# Patient Record
Sex: Male | Born: 1950 | Race: Black or African American | Hispanic: No | Marital: Single | Smoking: Former smoker
Health system: Southern US, Community
[De-identification: ages and names within clinical notes are randomized; demographics above are authoritative.]

## PROBLEM LIST (undated history)

## (undated) DIAGNOSIS — IMO0001 Reserved for inherently not codable concepts without codable children: Secondary | ICD-10-CM

## (undated) DIAGNOSIS — I10 Essential (primary) hypertension: Secondary | ICD-10-CM

## (undated) DIAGNOSIS — I639 Cerebral infarction, unspecified: Secondary | ICD-10-CM

## (undated) DIAGNOSIS — E785 Hyperlipidemia, unspecified: Secondary | ICD-10-CM

## (undated) DIAGNOSIS — F17200 Nicotine dependence, unspecified, uncomplicated: Secondary | ICD-10-CM

## (undated) HISTORY — DX: Hyperlipidemia, unspecified: E78.5

## (undated) HISTORY — DX: Cerebral infarction, unspecified: I63.9

---

## 2015-04-26 ENCOUNTER — Encounter (HOSPITAL_COMMUNITY): Payer: Self-pay

## 2015-04-26 ENCOUNTER — Emergency Department (HOSPITAL_COMMUNITY)
Admission: EM | Admit: 2015-04-26 | Discharge: 2015-04-26 | Disposition: A | Discharge status: Home / Self Care | Payer: No Typology Code available for payment source | Attending: Emergency Medicine | Admitting: Emergency Medicine

## 2015-04-26 DIAGNOSIS — Y92410 Unspecified street and highway as the place of occurrence of the external cause: Secondary | ICD-10-CM | POA: Diagnosis not present

## 2015-04-26 DIAGNOSIS — Y9389 Activity, other specified: Secondary | ICD-10-CM | POA: Insufficient documentation

## 2015-04-26 DIAGNOSIS — Z720 Tobacco use: Secondary | ICD-10-CM | POA: Diagnosis not present

## 2015-04-26 DIAGNOSIS — Z041 Encounter for examination and observation following transport accident: Secondary | ICD-10-CM | POA: Diagnosis not present

## 2015-04-26 DIAGNOSIS — S4992XA Unspecified injury of left shoulder and upper arm, initial encounter: Secondary | ICD-10-CM | POA: Diagnosis present

## 2015-04-26 DIAGNOSIS — Y998 Other external cause status: Secondary | ICD-10-CM | POA: Insufficient documentation

## 2015-04-26 NOTE — Discharge Instructions (Signed)
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
## 2016-10-22 ENCOUNTER — Inpatient Hospital Stay (HOSPITAL_COMMUNITY): Payer: Medicare Other

## 2016-10-22 ENCOUNTER — Encounter (HOSPITAL_COMMUNITY): Payer: Self-pay | Admitting: Neurology

## 2016-10-22 ENCOUNTER — Emergency Department (HOSPITAL_COMMUNITY): Payer: Medicare Other

## 2016-10-22 ENCOUNTER — Inpatient Hospital Stay (HOSPITAL_COMMUNITY)
Admission: EM | Admit: 2016-10-22 | Discharge: 2016-10-25 | DRG: 062 | Disposition: A | Discharge status: Home Health | Payer: Medicare Other | Attending: Neurology | Admitting: Neurology

## 2016-10-22 DIAGNOSIS — E785 Hyperlipidemia, unspecified: Secondary | ICD-10-CM | POA: Diagnosis present

## 2016-10-22 DIAGNOSIS — I63512 Cerebral infarction due to unspecified occlusion or stenosis of left middle cerebral artery: Secondary | ICD-10-CM | POA: Diagnosis not present

## 2016-10-22 DIAGNOSIS — Z23 Encounter for immunization: Secondary | ICD-10-CM | POA: Diagnosis not present

## 2016-10-22 DIAGNOSIS — Z6829 Body mass index (BMI) 29.0-29.9, adult: Secondary | ICD-10-CM | POA: Diagnosis not present

## 2016-10-22 DIAGNOSIS — R4702 Dysphasia: Secondary | ICD-10-CM | POA: Diagnosis not present

## 2016-10-22 DIAGNOSIS — R29810 Facial weakness: Secondary | ICD-10-CM | POA: Diagnosis present

## 2016-10-22 DIAGNOSIS — R911 Solitary pulmonary nodule: Secondary | ICD-10-CM

## 2016-10-22 DIAGNOSIS — E663 Overweight: Secondary | ICD-10-CM | POA: Diagnosis present

## 2016-10-22 DIAGNOSIS — R4189 Other symptoms and signs involving cognitive functions and awareness: Secondary | ICD-10-CM

## 2016-10-22 DIAGNOSIS — G8194 Hemiplegia, unspecified affecting left nondominant side: Secondary | ICD-10-CM | POA: Diagnosis present

## 2016-10-22 DIAGNOSIS — I63412 Cerebral infarction due to embolism of left middle cerebral artery: Principal | ICD-10-CM | POA: Diagnosis present

## 2016-10-22 DIAGNOSIS — F17200 Nicotine dependence, unspecified, uncomplicated: Secondary | ICD-10-CM | POA: Diagnosis not present

## 2016-10-22 DIAGNOSIS — I509 Heart failure, unspecified: Secondary | ICD-10-CM

## 2016-10-22 DIAGNOSIS — I6789 Other cerebrovascular disease: Secondary | ICD-10-CM | POA: Diagnosis not present

## 2016-10-22 DIAGNOSIS — F17210 Nicotine dependence, cigarettes, uncomplicated: Secondary | ICD-10-CM | POA: Diagnosis present

## 2016-10-22 DIAGNOSIS — I639 Cerebral infarction, unspecified: Secondary | ICD-10-CM | POA: Diagnosis present

## 2016-10-22 DIAGNOSIS — I10 Essential (primary) hypertension: Secondary | ICD-10-CM | POA: Diagnosis present

## 2016-10-22 DIAGNOSIS — Z720 Tobacco use: Secondary | ICD-10-CM

## 2016-10-22 DIAGNOSIS — R0989 Other specified symptoms and signs involving the circulatory and respiratory systems: Secondary | ICD-10-CM

## 2016-10-22 DIAGNOSIS — R4701 Aphasia: Secondary | ICD-10-CM | POA: Diagnosis present

## 2016-10-22 DIAGNOSIS — I69391 Dysphagia following cerebral infarction: Secondary | ICD-10-CM

## 2016-10-22 HISTORY — DX: Reserved for inherently not codable concepts without codable children: IMO0001

## 2016-10-22 HISTORY — DX: Nicotine dependence, unspecified, uncomplicated: F17.200

## 2016-10-22 HISTORY — DX: Essential (primary) hypertension: I10

## 2016-10-22 LAB — URINALYSIS, ROUTINE W REFLEX MICROSCOPIC
Bilirubin Urine: NEGATIVE
Glucose, UA: NEGATIVE mg/dL
Ketones, ur: 15 mg/dL — AB
LEUKOCYTES UA: NEGATIVE
Nitrite: NEGATIVE
PROTEIN: 100 mg/dL — AB
Specific Gravity, Urine: 1.016 (ref 1.005–1.030)
pH: 5.5 (ref 5.0–8.0)

## 2016-10-22 LAB — URINE MICROSCOPIC-ADD ON

## 2016-10-22 LAB — COMPREHENSIVE METABOLIC PANEL
ALT: 30 U/L (ref 17–63)
AST: 25 U/L (ref 15–41)
Albumin: 3.8 g/dL (ref 3.5–5.0)
Alkaline Phosphatase: 56 U/L (ref 38–126)
Anion gap: 9 (ref 5–15)
BILIRUBIN TOTAL: 1.7 mg/dL — AB (ref 0.3–1.2)
BUN: 14 mg/dL (ref 6–20)
CO2: 23 mmol/L (ref 22–32)
CREATININE: 1.21 mg/dL (ref 0.61–1.24)
Calcium: 8.9 mg/dL (ref 8.9–10.3)
Chloride: 109 mmol/L (ref 101–111)
Glucose, Bld: 93 mg/dL (ref 65–99)
POTASSIUM: 4.1 mmol/L (ref 3.5–5.1)
Sodium: 141 mmol/L (ref 135–145)
TOTAL PROTEIN: 6.6 g/dL (ref 6.5–8.1)

## 2016-10-22 LAB — I-STAT CHEM 8, ED
BUN: 17 mg/dL (ref 6–20)
CALCIUM ION: 1.04 mmol/L — AB (ref 1.15–1.40)
CREATININE: 1.2 mg/dL (ref 0.61–1.24)
Chloride: 109 mmol/L (ref 101–111)
GLUCOSE: 86 mg/dL (ref 65–99)
HCT: 48 % (ref 39.0–52.0)
HEMOGLOBIN: 16.3 g/dL (ref 13.0–17.0)
Potassium: 4.1 mmol/L (ref 3.5–5.1)
Sodium: 141 mmol/L (ref 135–145)
TCO2: 23 mmol/L (ref 0–100)

## 2016-10-22 LAB — PROTIME-INR
INR: 1.07
Prothrombin Time: 13.9 seconds (ref 11.4–15.2)

## 2016-10-22 LAB — DIFFERENTIAL
BASOS ABS: 0 10*3/uL (ref 0.0–0.1)
Basophils Relative: 0 %
EOS ABS: 0.1 10*3/uL (ref 0.0–0.7)
EOS PCT: 1 %
LYMPHS ABS: 2.1 10*3/uL (ref 0.7–4.0)
Lymphocytes Relative: 36 %
MONO ABS: 0.3 10*3/uL (ref 0.1–1.0)
MONOS PCT: 6 %
Neutro Abs: 3.3 10*3/uL (ref 1.7–7.7)
Neutrophils Relative %: 57 %

## 2016-10-22 LAB — RAPID URINE DRUG SCREEN, HOSP PERFORMED
Amphetamines: NOT DETECTED
BARBITURATES: NOT DETECTED
Benzodiazepines: NOT DETECTED
COCAINE: NOT DETECTED
Opiates: NOT DETECTED
Tetrahydrocannabinol: NOT DETECTED

## 2016-10-22 LAB — CBC
HEMATOCRIT: 44.7 % (ref 39.0–52.0)
HEMOGLOBIN: 15.4 g/dL (ref 13.0–17.0)
MCH: 31.6 pg (ref 26.0–34.0)
MCHC: 34.5 g/dL (ref 30.0–36.0)
MCV: 91.8 fL (ref 78.0–100.0)
Platelets: 175 10*3/uL (ref 150–400)
RBC: 4.87 MIL/uL (ref 4.22–5.81)
RDW: 13.9 % (ref 11.5–15.5)
WBC: 5.8 10*3/uL (ref 4.0–10.5)

## 2016-10-22 LAB — I-STAT TROPONIN, ED: TROPONIN I, POC: 0.06 ng/mL (ref 0.00–0.08)

## 2016-10-22 LAB — ETHANOL

## 2016-10-22 LAB — APTT: aPTT: 29 seconds (ref 24–36)

## 2016-10-22 LAB — MRSA PCR SCREENING: MRSA BY PCR: NEGATIVE

## 2016-10-22 MED ORDER — ENSURE ENLIVE PO LIQD
237.0000 mL | Freq: Two times a day (BID) | ORAL | Status: DC
  Administered 2016-10-23 – 2016-10-25 (×4): 237 mL via ORAL

## 2016-10-22 MED ORDER — LABETALOL HCL 5 MG/ML IV SOLN
10.0000 mg | INTRAVENOUS | Status: DC | PRN
  Administered 2016-10-22 (×2): 10 mg via INTRAVENOUS
  Filled 2016-10-22 (×3): qty 4

## 2016-10-22 MED ORDER — ALTEPLASE (STROKE) FULL DOSE INFUSION
0.9000 mg/kg | Freq: Once | INTRAVENOUS | Status: AC
  Administered 2016-10-22: 85 mg via INTRAVENOUS
  Filled 2016-10-22: qty 100

## 2016-10-22 MED ORDER — NICARDIPINE HCL IN NACL 20-0.86 MG/200ML-% IV SOLN: 3.0000 mg/h | INTRAVENOUS | Status: DC

## 2016-10-22 MED ORDER — PNEUMOCOCCAL VAC POLYVALENT 25 MCG/0.5ML IJ INJ
0.5000 mL | INJECTION | INTRAMUSCULAR | Status: AC
  Administered 2016-10-23: 0.5 mL via INTRAMUSCULAR
  Filled 2016-10-22: qty 0.5

## 2016-10-22 MED ORDER — INFLUENZA VAC SPLIT QUAD 0.5 ML IM SUSY
0.5000 mL | PREFILLED_SYRINGE | INTRAMUSCULAR | Status: AC
  Administered 2016-10-23: 0.5 mL via INTRAMUSCULAR
  Filled 2016-10-22: qty 0.5

## 2016-10-22 MED ORDER — SODIUM CHLORIDE 0.9 % IV SOLN
INTRAVENOUS | Status: DC
  Administered 2016-10-22: 18:00:00 via INTRAVENOUS

## 2016-10-22 MED ORDER — ACETAMINOPHEN 650 MG RE SUPP: 650.0000 mg | RECTAL | Status: DC | PRN

## 2016-10-22 MED ORDER — AMLODIPINE BESYLATE 5 MG PO TABS
5.0000 mg | ORAL_TABLET | Freq: Every day | ORAL | Status: DC
  Administered 2016-10-22: 5 mg via ORAL
  Filled 2016-10-22: qty 1

## 2016-10-22 MED ORDER — ACETAMINOPHEN 325 MG PO TABS: 650.0000 mg | ORAL_TABLET | ORAL | Status: DC | PRN

## 2016-10-22 MED ORDER — SENNOSIDES-DOCUSATE SODIUM 8.6-50 MG PO TABS: 1.0000 | ORAL_TABLET | Freq: Every evening | ORAL | Status: DC | PRN

## 2016-10-22 MED ORDER — FAMOTIDINE IN NACL 20-0.9 MG/50ML-% IV SOLN
20.0000 mg | Freq: Two times a day (BID) | INTRAVENOUS | Status: DC
  Administered 2016-10-22 – 2016-10-24 (×4): 20 mg via INTRAVENOUS
  Filled 2016-10-22 (×4): qty 50

## 2016-10-22 MED ORDER — IOPAMIDOL (ISOVUE-370) INJECTION 76%
INTRAVENOUS | Status: AC
  Administered 2016-10-22: 100 mL
  Filled 2016-10-22: qty 100

## 2016-10-22 MED ORDER — STROKE: EARLY STAGES OF RECOVERY BOOK
Freq: Once | Status: AC
  Administered 2016-10-22: 18:00:00
  Filled 2016-10-22: qty 1
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
## 2016-10-22 NOTE — ED Notes (Signed)
 10mg  labetolol given at 1519 for BP 148/110

## 2016-10-22 NOTE — Progress Notes (Signed)
Paged MD regarding return of facial droop and increasing aphasia. (NIH increase from 1 to 5).

## 2016-10-22 NOTE — Progress Notes (Signed)
Paged MD on completion of EKG. Pt returned to baseline and blood pressure came down before labetalol was given. NIH  1.

## 2016-10-22 NOTE — ED Notes (Signed)
Report given to Waltersasey, RN on [id number]. Transported to ICU by Council MechanicHelle, RN and Shanda BumpsJessica, Stroke RN.

## 2016-10-22 NOTE — ED Notes (Signed)
Patient has returned from CT at this time. 
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
## 2016-10-22 NOTE — Progress Notes (Addendum)
Paged regarding worsening aphasia. Patient went from mild aphasia with following commands to dense aphasia. He had a slightly high diastolic(115) and received labetalol for this. His worsening happened at a BP of 124/94. I advised NS bolus and stat CT which was unrevealing, CT angio/perfusion also unrevealing. Following CT, his BP improved as did his aphasia, back to his (since arrival to ICU) baseline.   At this time, I suspect that he has a distal cortical area of ischemia which is blood pressure dependant. In this setting, I would not favor being as aggressive with BP  As we typically are in the post TPA period, especially given the severity of the deficits he developed. I have advised to increase the parameters to 185/120 to trigger treatment. If he sustains high BP, then this may need to be considered with a short acting agent which could be reversed if needed.   Will continue to monitor.  This patient is critically ill and at significant risk of neurological worsening, death and care requires constant monitoring of vital signs, hemodynamics,respiratory and cardiac monitoring, neurological assessment, discussion with family, other specialists and medical decision making of high complexity. I spent 40 minutes of neurocritical care time  in the care of  this patient.  Ritta SlotMcNeill Kirkpatrick, MD [facility] [phone]  If 7pm- 7am, please page neurology on call as listed in AMION. 10/22/2016  10:30 PM

## 2016-10-22 NOTE — ED Notes (Signed)
Patient undressed, in gown, on monitor, continuous pulse oximetry and blood pressure cuff 

## 2016-10-22 NOTE — ED Triage Notes (Signed)
Per EMS - pt called family member around [phone] today. Pt went over to family member's house, did not get out of car. Family member went to car and noticed pt drooling, not responding verbally. Upon EMS arrival, pt tracking with eyes, able to answer some questions by shaking head yes and no. Pt not following commands, difficulty getting words out.

## 2016-10-22 NOTE — Progress Notes (Signed)
Paged MD regarding patient diaphoretic, and high blood pressure. NIH 1. Orders received for EKG, 5 mg Labetalol. Pt denies chest pain.

## 2016-10-23 ENCOUNTER — Inpatient Hospital Stay (HOSPITAL_COMMUNITY): Payer: Medicare Other

## 2016-10-23 ENCOUNTER — Other Ambulatory Visit (HOSPITAL_COMMUNITY): Payer: Medicare Other

## 2016-10-23 ENCOUNTER — Other Ambulatory Visit (HOSPITAL_COMMUNITY): Payer: Self-pay | Admitting: Radiology

## 2016-10-23 DIAGNOSIS — R4189 Other symptoms and signs involving cognitive functions and awareness: Secondary | ICD-10-CM

## 2016-10-23 DIAGNOSIS — F17200 Nicotine dependence, unspecified, uncomplicated: Secondary | ICD-10-CM

## 2016-10-23 DIAGNOSIS — Z720 Tobacco use: Secondary | ICD-10-CM

## 2016-10-23 DIAGNOSIS — I639 Cerebral infarction, unspecified: Secondary | ICD-10-CM

## 2016-10-23 DIAGNOSIS — I63412 Cerebral infarction due to embolism of left middle cerebral artery: Principal | ICD-10-CM

## 2016-10-23 DIAGNOSIS — I69391 Dysphagia following cerebral infarction: Secondary | ICD-10-CM

## 2016-10-23 DIAGNOSIS — I10 Essential (primary) hypertension: Secondary | ICD-10-CM

## 2016-10-23 DIAGNOSIS — R911 Solitary pulmonary nodule: Secondary | ICD-10-CM

## 2016-10-23 DIAGNOSIS — E785 Hyperlipidemia, unspecified: Secondary | ICD-10-CM

## 2016-10-23 DIAGNOSIS — R4701 Aphasia: Secondary | ICD-10-CM

## 2016-10-23 HISTORY — DX: Cerebral infarction, unspecified: I63.9

## 2016-10-23 LAB — LIPID PANEL
CHOL/HDL RATIO: 4.1 ratio
CHOLESTEROL: 198 mg/dL (ref 0–200)
HDL: 48 mg/dL (ref >40)
LDL CALC: 127 mg/dL — AB (ref 0–99)
TRIGLYCERIDES: 117 mg/dL (ref <150)
VLDL: 23 mg/dL (ref 0–40)

## 2016-10-23 MED ORDER — SODIUM CHLORIDE 0.9 % IV SOLN
500.0000 mL | Freq: Once | INTRAVENOUS | Status: AC
Start: 2016-10-22 — End: 2016-10-22
  Administered 2016-10-22: 500 mL via INTRAVENOUS

## 2016-10-23 MED ORDER — ATORVASTATIN CALCIUM 20 MG PO TABS: 20.0000 mg | ORAL_TABLET | Freq: Every day | ORAL | Status: DC

## 2016-10-23 MED ORDER — LABETALOL HCL 5 MG/ML IV SOLN: 10.0000 mg | INTRAVENOUS | Status: DC | PRN

## 2016-10-23 MED ORDER — SODIUM CHLORIDE 0.9 % IV SOLN
INTRAVENOUS | Status: DC
  Administered 2016-10-23 – 2016-10-25 (×3): via INTRAVENOUS

## 2016-10-23 MED ORDER — ORAL CARE MOUTH RINSE
15.0000 mL | Freq: Two times a day (BID) | OROMUCOSAL | Status: DC
  Administered 2016-10-23 – 2016-10-25 (×4): 15 mL via OROMUCOSAL

## 2016-10-23 MED ORDER — FUROSEMIDE 10 MG/ML IJ SOLN
10.0000 mg | Freq: Once | INTRAMUSCULAR | Status: AC
  Administered 2016-10-23: 10 mg via INTRAVENOUS
  Filled 2016-10-23: qty 2

## 2016-10-23 MED ORDER — ALBUMIN HUMAN 5 % IV SOLN
12.5000 g | Freq: Four times a day (QID) | INTRAVENOUS | Status: AC
  Administered 2016-10-23 – 2016-10-24 (×4): 12.5 g via INTRAVENOUS
  Filled 2016-10-23 (×4): qty 250

## 2016-10-23 MED ORDER — SODIUM CHLORIDE 0.9 % IV SOLN
500.0000 mL | Freq: Once | INTRAVENOUS | Status: AC
  Administered 2016-10-23: 500 mL via INTRAVENOUS

## 2016-10-23 NOTE — Consult Note (Signed)
Physical Medicine and Rehabilitation Consult Reason for Consult: CVA Referring Physician: Dr.Xu    HPI: Ronald Terry is a 65 y.o.right handed  male with history of hypertension as well as tobacco abuse. Per chart review and pt's son, patient lives with brother. Independent prior to admission. One level home with 5 steps to entry. Presented 10/22/2016 with right-sided weakness and aphasia. Cranial CT scan negative. Patient did receive TPA. CTA of head and neck showed no high-grade or correctable stenosis. MRI and MRA are pending. Incidental finding of potential clinical significance found 7 mm right upper lobe pulmonary nodule advise CT of chest in 6-12 months. EEG negative. Echocardiogram pending. Neurology consulted with workup currently ongoing. Physical and occupational therapy evaluation completed 10/23/2016 with recommendations of physical medicine rehabilitation consult. Earlier today, noted to be unresponsive, then with left flaccid weakness, but ?back to baseline 2 hours later.    Review of Systems  Constitutional: Negative for chills and fever.  Eyes: Negative for blurred vision and double vision.  Respiratory: Negative for cough and shortness of breath.   Cardiovascular: Negative for chest pain, palpitations and leg swelling.  Gastrointestinal: Positive for constipation. Negative for nausea and vomiting.  Genitourinary: Positive for urgency. Negative for dysuria and hematuria.  Skin: Negative for rash.  Neurological: Negative for sensory change, speech change, focal weakness, seizures and headaches.       Transient weakness  All other systems reviewed and are negative.  Past Medical History:  Diagnosis Date  . HTN (hypertension)   . Smoking    History reviewed. No pertinent surgical history. History reviewed. No pertinent family history. Social History:  reports that he has been smoking Cigarettes.  He has a 50.00 pack-year smoking history. He has never used smokeless  tobacco. He reports that he drinks alcohol. He reports that he does not use drugs. Allergies: No Known Allergies Medications Prior to Admission  Medication Sig Dispense Refill  . acetaminophen (TYLENOL) 500 MG tablet Take 1,500 mg by mouth every 6 (six) hours as needed for moderate pain or headache.      Home: Home Living Family/patient expects to be discharged to:: Private residence Living Arrangements: Other relatives (lives with brother) Available Help at Discharge: Family, Available 24 hours/day Type of Home: House Home Access: Stairs to enter Secretary/administrator of Steps: 5 Home Layout: One level Bathroom Shower/Tub: Engineer, manufacturing systems: Standard Home Equipment: None Additional Comments: drives   Lives With: Family (brother)  Functional History: Prior Function Level of Independence: Independent Functional Status:  Mobility: Bed Mobility Overal bed mobility: Needs Assistance Bed Mobility: Rolling Rolling: +2 for physical assistance, Max assist General bed mobility comments: Pt log R and L to complete peri care. Pt no initation at this time. flaccid L UE  Transfers General transfer comment: n/a this time      ADL: ADL Overall ADL's : Needs assistance/impaired Eating/Feeding: Set up, Bed level Eating/Feeding Details (indicate cue type and reason): completed full meal on arrival General ADL Comments: Pt supine alert on arrival and greeted therapist. Pt reports breakfast was good and he ate it all. pt noted to be incontinent of bladder and bowel. Pt with decr arousal in the middle of discussion. Speech more slurred in nature. Pt log rolled R and L for hygiene . RN called to room due to decr arousal and L buttock with wound present  Cognition: Cognition Overall Cognitive Status: Impaired/Different from baseline Arousal/Alertness: Awake/alert Orientation Level: Oriented to place, Oriented to person, Oriented to  situation Attention: Sustained Sustained  Attention: Appears intact Memory: Impaired Memory Impairment: Storage deficit, Retrieval deficit, Decreased recall of new information (1/2 independent, 1/4 semantic cue, 2/4 incorrect w/ choice) Awareness: Appears intact Problem Solving: Appears intact Safety/Judgment: Impaired (questionable) Cognition Arousal/Alertness: Lethargic Behavior During Therapy: Flat affect Overall Cognitive Status: Impaired/Different from baseline  Blood pressure 123/81, pulse 92, temperature 97.7 F (36.5 C), temperature source Oral, resp. rate 18, height 5\' 9"  (1.753 m), weight 90.4 kg (199 lb 4.7 oz), SpO2 99 %. Physical Exam  Vitals reviewed. Constitutional: He appears well-developed and well-nourished.  HENT:  Head: Normocephalic and atraumatic.  Poor dentition  Eyes: EOM are normal.  Pupils reactive to light  Neck: Normal range of motion. Neck supple. No thyromegaly present.  Cardiovascular: Normal rate and regular rhythm.   Respiratory: Effort normal and breath sounds normal. No respiratory distress.  GI: Soft. Bowel sounds are normal. He exhibits no distension.  Musculoskeletal: He exhibits no edema or tenderness.  Neurological: He is alert.  Makes good eye contact with examiner.  Waxing/Waning congnition and strength.   A&Ox3 Motor: ~5/5 throughout. DTRs symmetric Per nursing and son left side flaccid with facial droop and slurred speech ~2 hours prior to exam  Skin: Skin is warm and dry.  Psychiatric:  Mood and affect appear slightly altered with ?inappropriate laughter, however pt's son, states at baseline    Results for orders placed or performed during the hospital encounter of 10/22/16 (from the past 24 hour(s))  Ethanol     Status: None   Collection Time: 10/22/16  3:03 PM  Result Value Ref Range   Alcohol, Ethyl (B) <5 <5 mg/dL  Protime-INR     Status: None   Collection Time: 10/22/16  3:03 PM  Result Value Ref Range   Prothrombin Time 13.9 11.4 - 15.2 seconds   INR 1.07     APTT     Status: None   Collection Time: 10/22/16  3:03 PM  Result Value Ref Range   aPTT 29 24 - 36 seconds  CBC     Status: None   Collection Time: 10/22/16  3:03 PM  Result Value Ref Range   WBC 5.8 4.0 - 10.5 K/uL   RBC 4.87 4.22 - 5.81 MIL/uL   Hemoglobin 15.4 13.0 - 17.0 g/dL   HCT 16.1 09.6 - 04.5 %   MCV 91.8 78.0 - 100.0 fL   MCH 31.6 26.0 - 34.0 pg   MCHC 34.5 30.0 - 36.0 g/dL   RDW 40.9 81.1 - 91.4 %   Platelets 175 150 - 400 K/uL  Differential     Status: None   Collection Time: 10/22/16  3:03 PM  Result Value Ref Range   Neutrophils Relative % 57 %   Neutro Abs 3.3 1.7 - 7.7 K/uL   Lymphocytes Relative 36 %   Lymphs Abs 2.1 0.7 - 4.0 K/uL   Monocytes Relative 6 %   Monocytes Absolute 0.3 0.1 - 1.0 K/uL   Eosinophils Relative 1 %   Eosinophils Absolute 0.1 0.0 - 0.7 K/uL   Basophils Relative 0 %   Basophils Absolute 0.0 0.0 - 0.1 K/uL  Comprehensive metabolic panel     Status: Abnormal   Collection Time: 10/22/16  3:03 PM  Result Value Ref Range   Sodium 141 135 - 145 mmol/L   Potassium 4.1 3.5 - 5.1 mmol/L   Chloride 109 101 - 111 mmol/L   CO2 23 22 - 32 mmol/L   Glucose, Bld 93 65 -  99 mg/dL   BUN 14 6 - 20 mg/dL   Creatinine, Ser 1.61 0.61 - 1.24 mg/dL   Calcium 8.9 8.9 - 09.6 mg/dL   Total Protein 6.6 6.5 - 8.1 g/dL   Albumin 3.8 3.5 - 5.0 g/dL   AST 25 15 - 41 U/L   ALT 30 17 - 63 U/L   Alkaline Phosphatase 56 38 - 126 U/L   Total Bilirubin 1.7 (H) 0.3 - 1.2 mg/dL   GFR calc non Af Amer >60 >60 mL/min   GFR calc Af Amer >60 >60 mL/min   Anion gap 9 5 - 15  I-stat troponin, ED (not at [facility], [facility])     Status: None   Collection Time: 10/22/16  3:05 PM  Result Value Ref Range   Troponin i, poc 0.06 0.00 - 0.08 ng/mL   Comment 3          I-Stat Chem 8, ED  (not at [facility], [facility])     Status: Abnormal   Collection Time: 10/22/16  3:07 PM  Result Value Ref Range   Sodium 141 135 - 145 mmol/L   Potassium 4.1 3.5 - 5.1 mmol/L   Chloride 109 101 - 111  mmol/L   BUN 17 6 - 20 mg/dL   Creatinine, Ser 0.45 0.61 - 1.24 mg/dL   Glucose, Bld 86 65 - 99 mg/dL   Calcium, Ion 4.09 (L) 1.15 - 1.40 mmol/L   TCO2 23 0 - 100 mmol/L   Hemoglobin 16.3 13.0 - 17.0 g/dL   HCT 81.1 91.4 - 78.2 %  MRSA PCR Screening     Status: None   Collection Time: 10/22/16  4:14 PM  Result Value Ref Range   MRSA by PCR NEGATIVE NEGATIVE  Urine rapid drug screen (hosp performed)not at [facility]     Status: None   Collection Time: 10/22/16  5:42 PM  Result Value Ref Range   Opiates NONE DETECTED NONE DETECTED   Cocaine NONE DETECTED NONE DETECTED   Benzodiazepines NONE DETECTED NONE DETECTED   Amphetamines NONE DETECTED NONE DETECTED   Tetrahydrocannabinol NONE DETECTED NONE DETECTED   Barbiturates NONE DETECTED NONE DETECTED  Urinalysis, Routine w reflex microscopic (not at [facility])     Status: Abnormal   Collection Time: 10/22/16  5:42 PM  Result Value Ref Range   Color, Urine YELLOW YELLOW   APPearance CLEAR CLEAR   Specific Gravity, Urine 1.016 1.005 - 1.030   pH 5.5 5.0 - 8.0   Glucose, UA NEGATIVE NEGATIVE mg/dL   Hgb urine dipstick SMALL (A) NEGATIVE   Bilirubin Urine NEGATIVE NEGATIVE   Ketones, ur 15 (A) NEGATIVE mg/dL   Protein, ur 956 (A) NEGATIVE mg/dL   Nitrite NEGATIVE NEGATIVE   Leukocytes, UA NEGATIVE NEGATIVE  Urine microscopic-add on     Status: Abnormal   Collection Time: 10/22/16  5:42 PM  Result Value Ref Range   Squamous Epithelial / LPF 0-5 (A) NONE SEEN   WBC, UA 0-5 0 - 5 WBC/hpf   RBC / HPF 0-5 0 - 5 RBC/hpf   Bacteria, UA FEW (A) NONE SEEN   Casts GRANULAR CAST (A) NEGATIVE   Urine-Other MUCOUS PRESENT   Lipid panel     Status: Abnormal   Collection Time: 10/23/16  2:19 AM  Result Value Ref Range   Cholesterol 198 0 - 200 mg/dL   Triglycerides 213 <086 mg/dL   HDL 48 >57 mg/dL   Total CHOL/HDL Ratio 4.1 RATIO   VLDL 23 0 -  40 mg/dL   LDL Cholesterol 119 (H) 0 - 99 mg/dL   Ct Angio Head W Or Wo Contrast  Result Date:  10/22/2016 CLINICAL DATA:  Initial evaluation for acute aphasia. EXAM: CT CEREBRAL PERFUSION WITH CONTRAST; CT ANGIOGRAPHY NECK; CT ANGIOGRAPHY HEAD TECHNIQUE: Multidetector CT imaging of the neck was performed during bolus injection of intravenous contrast. Multiplanar CT angiographic image reconstructions including MIPs were generated to evaluate the extra-cranial carotid arteries.; Multidetector CT imaging of the brain was performed during bolus injection of intravenous contrast. Multiplanar CT angiographic image reconstructions including MIPs were generated to evaluate cerebral vasculature centered at the Circle of [place]. CONTRAST:  50 cc of Isovue 370. COMPARISON:  Prior noncontrast head CT performed earlier the same day. FINDINGS: CTA NECK Aortic arch: Visualized aortic arch is of normal caliber with normal 3 vessel morphology. No high-grade stenosis seen at the origin of the great vessels. Visualized subclavian arteries are widely patent and unremarkable. Right carotid system: Right common carotid artery widely patent from its origin to the bifurcation. Mild a centric calcified plaque about the right bifurcation without flow-limiting stenosis. Right ICA widely patent distally to the skullbase without stenosis, dissection, or occlusion. Right external carotid artery and its branches grossly normal. Left carotid system: Left common carotid artery widely patent from its origin to the bifurcation. Mild noncalcified atheromatous plaque about the left bifurcation without significant stenosis. Left ICA widely patent from the bifurcation to the skullbase. No stenosis, occlusion, or evidence for dissection within the left carotid artery system. Vertebral arteries: Both vertebral arteries arise from the subclavian arteries. Vertebral arteries patent within the neck without stenosis, dissection, or occlusion. Skeleton: No acute osseous abnormality. No worrisome lytic or blastic osseous lesions. Moderate to advanced  degenerative spondylolysis present at C4-5 through C6-7. Soft tissues of the neck: No acute soft tissue abnormality identified within the neck. Thyroid normal. No adenopathy. Upper chest: Enlarged mediastinal lymph nodes noted, measuring up to 15 mm in short access, indeterminate. Emphysematous changes noted within the visualized lungs. Scatter peribronchial thickening noted. 7 mm nodule at the posterior right upper lobe (series 501, image 22). CTA HEAD Anterior circulation: The petrous segments are widely patent bilaterally. Mild atheromatous plaque within the cavernous left ICA without significant stenosis. Cavernous and supraclinoid ICAs otherwise widely patent without flow-limiting stenosis. A1 segments widely patent. Anterior communicating artery normal. Anterior cerebral arteries well opacified to their distal aspects. M1 segments patent without stenosis or occlusion. MCA bifurcations within normal limits. No proximal M2 occlusion. There is an apparent focal severe proximal M2 stenosis on the left just prior to distal M3 branches (series 502, image 284 on axial sequence, series 504, image 173 on sagittal sequence). Contrast is seen distally within M3 branches. Distal MCA branches otherwise well opacified and symmetric. Posterior circulation: Vertebral arteries patent to the vertebrobasilar junction. Focal plaque within the left V4 segment with mild short-segment stenosis. Posterior inferior cerebral arteries are patent bilaterally. Basilar artery tortuous but widely patent to its distal aspect. Superior cerebellar arteries well opacified bilaterally. Both of the posterior cerebral artery supplied via the basilar artery and are well per few to the distal aspects. Venous sinuses: Patent.  No evidence for venous sinus thrombosis. Anatomic variants: No significant anatomic variant. No aneurysm or vascular malformation. Delayed phase:  Not performed. CT PERFUSION: CT perfusion was performed, however, no diagnostic  images were obtained. There is question of the perfusion scan true bearing off correct intracranial arterial vasculature, as the arterial and venous perfusion grafts closely mimic  each other. No appreciable perfusion mismatch is observed. IMPRESSION: CTA NECK IMPRESSION: 1. Mild atheromatous plaque about the carotid bifurcations bilaterally without flow limiting stenosis. No high-grade or correctable stenosis identified within either carotid artery system. 2. Patent vertebral arteries within the neck. 3. Enlarged mediastinal adenopathy as above, indeterminate. Further evaluation with dedicated CT of the chest suggested for complete evaluation. 4. Emphysema. 5. **An incidental finding of potential clinical significance has been found. 7 mm right upper lobe pulmonary nodule. Non-contrast chest CT at 6-12 months is recommended. If the nodule is stable at time of repeat CT, then future CT at 18-24 months (from today's scan) is considered optional for low-risk patients, but is recommended for high-risk patients. This recommendation follows the consensus statement: Guidelines for Management of Incidental Pulmonary Nodules Detected on CT Images: From the Fleischner Society 2017; Radiology 2017; 284:228-243.** CTA HEAD IMPRESSION: 1. Negative CTA for emergent large vessel occlusion. No correctable stenosis identified. 2. Probable short-segment severe distal left M2 stenosis as above. No other high-grade or critical stenosis within the intracranial circulation. 3. Mild atheromatous plaque within the carotid siphons bilaterally with mild stenosis. 4. Focal plaque within the left V4 segment with short-segment mild stenosis. CT PERFUSION IMPRESSION: Nondiagnostic CT perfusion due to technical error as detailed above. Critical Value/emergent results were discussed by telephone at the time of interpretation on 10/22/2016 at approximately the 10:30 pm to Dr. Ritta Slot , who verbally acknowledged these results.  Electronically Signed   By: Rise Mu M.D.   On: 10/22/2016 23:40   Ct Head Wo Contrast  Result Date: 10/23/2016 CLINICAL DATA:  65 year old male with elevated blood pressure. Increased confusion and slurred speech. Subsequent encounter. EXAM: CT HEAD WITHOUT CONTRAST TECHNIQUE: Contiguous axial images were obtained from the base of the skull through the vertex without intravenous contrast. COMPARISON:  CT angiogram and CT head 10/22/2012. FINDINGS: Brain: No intracranial hemorrhage or CT evidence of large acute infarct. Mild to moderate chronic microvascular changes. No hydrocephalus. No intracranial mass lesion noted on this unenhanced exam. Partially empty expanded sella. No second very findings of pseudotumor. Vascular: Mild plaque without hyperdense vessel. Skull: No acute abnormality. Sinuses/Orbits: Mild exophthalmos. Sinuses which are visualized are clear. Other: Negative IMPRESSION: No intracranial hemorrhage or CT evidence of large acute infarct. Mild to moderate chronic microvascular changes. Partially empty expanded sella. Electronically Signed   By: Lacy Duverney M.D.   On: 10/23/2016 10:39   Ct Angio Neck W Or Wo Contrast  Result Date: 10/22/2016 CLINICAL DATA:  Initial evaluation for acute aphasia. EXAM: CT CEREBRAL PERFUSION WITH CONTRAST; CT ANGIOGRAPHY NECK; CT ANGIOGRAPHY HEAD TECHNIQUE: Multidetector CT imaging of the neck was performed during bolus injection of intravenous contrast. Multiplanar CT angiographic image reconstructions including MIPs were generated to evaluate the extra-cranial carotid arteries.; Multidetector CT imaging of the brain was performed during bolus injection of intravenous contrast. Multiplanar CT angiographic image reconstructions including MIPs were generated to evaluate cerebral vasculature centered at the Circle of [place]. CONTRAST:  50 cc of Isovue 370. COMPARISON:  Prior noncontrast head CT performed earlier the same day. FINDINGS: CTA NECK  Aortic arch: Visualized aortic arch is of normal caliber with normal 3 vessel morphology. No high-grade stenosis seen at the origin of the great vessels. Visualized subclavian arteries are widely patent and unremarkable. Right carotid system: Right common carotid artery widely patent from its origin to the bifurcation. Mild a centric calcified plaque about the right bifurcation without flow-limiting stenosis. Right ICA widely patent distally to the skullbase  without stenosis, dissection, or occlusion. Right external carotid artery and its branches grossly normal. Left carotid system: Left common carotid artery widely patent from its origin to the bifurcation. Mild noncalcified atheromatous plaque about the left bifurcation without significant stenosis. Left ICA widely patent from the bifurcation to the skullbase. No stenosis, occlusion, or evidence for dissection within the left carotid artery system. Vertebral arteries: Both vertebral arteries arise from the subclavian arteries. Vertebral arteries patent within the neck without stenosis, dissection, or occlusion. Skeleton: No acute osseous abnormality. No worrisome lytic or blastic osseous lesions. Moderate to advanced degenerative spondylolysis present at C4-5 through C6-7. Soft tissues of the neck: No acute soft tissue abnormality identified within the neck. Thyroid normal. No adenopathy. Upper chest: Enlarged mediastinal lymph nodes noted, measuring up to 15 mm in short access, indeterminate. Emphysematous changes noted within the visualized lungs. Scatter peribronchial thickening noted. 7 mm nodule at the posterior right upper lobe (series 501, image 22). CTA HEAD Anterior circulation: The petrous segments are widely patent bilaterally. Mild atheromatous plaque within the cavernous left ICA without significant stenosis. Cavernous and supraclinoid ICAs otherwise widely patent without flow-limiting stenosis. A1 segments widely patent. Anterior communicating  artery normal. Anterior cerebral arteries well opacified to their distal aspects. M1 segments patent without stenosis or occlusion. MCA bifurcations within normal limits. No proximal M2 occlusion. There is an apparent focal severe proximal M2 stenosis on the left just prior to distal M3 branches (series 502, image 284 on axial sequence, series 504, image 173 on sagittal sequence). Contrast is seen distally within M3 branches. Distal MCA branches otherwise well opacified and symmetric. Posterior circulation: Vertebral arteries patent to the vertebrobasilar junction. Focal plaque within the left V4 segment with mild short-segment stenosis. Posterior inferior cerebral arteries are patent bilaterally. Basilar artery tortuous but widely patent to its distal aspect. Superior cerebellar arteries well opacified bilaterally. Both of the posterior cerebral artery supplied via the basilar artery and are well per few to the distal aspects. Venous sinuses: Patent.  No evidence for venous sinus thrombosis. Anatomic variants: No significant anatomic variant. No aneurysm or vascular malformation. Delayed phase:  Not performed. CT PERFUSION: CT perfusion was performed, however, no diagnostic images were obtained. There is question of the perfusion scan true bearing off correct intracranial arterial vasculature, as the arterial and venous perfusion grafts closely mimic each other. No appreciable perfusion mismatch is observed. IMPRESSION: CTA NECK IMPRESSION: 1. Mild atheromatous plaque about the carotid bifurcations bilaterally without flow limiting stenosis. No high-grade or correctable stenosis identified within either carotid artery system. 2. Patent vertebral arteries within the neck. 3. Enlarged mediastinal adenopathy as above, indeterminate. Further evaluation with dedicated CT of the chest suggested for complete evaluation. 4. Emphysema. 5. **An incidental finding of potential clinical significance has been found. 7 mm right  upper lobe pulmonary nodule. Non-contrast chest CT at 6-12 months is recommended. If the nodule is stable at time of repeat CT, then future CT at 18-24 months (from today's scan) is considered optional for low-risk patients, but is recommended for high-risk patients. This recommendation follows the consensus statement: Guidelines for Management of Incidental Pulmonary Nodules Detected on CT Images: From the Fleischner Society 2017; Radiology 2017; 284:228-243.** CTA HEAD IMPRESSION: 1. Negative CTA for emergent large vessel occlusion. No correctable stenosis identified. 2. Probable short-segment severe distal left M2 stenosis as above. No other high-grade or critical stenosis within the intracranial circulation. 3. Mild atheromatous plaque within the carotid siphons bilaterally with mild stenosis. 4. Focal plaque within the  left V4 segment with short-segment mild stenosis. CT PERFUSION IMPRESSION: Nondiagnostic CT perfusion due to technical error as detailed above. Critical Value/emergent results were discussed by telephone at the time of interpretation on 10/22/2016 at approximately the 10:30 pm to Dr. Ritta Slot , who verbally acknowledged these results. Electronically Signed   By: Rise Mu M.D.   On: 10/22/2016 23:40   Ct Cerebral Perfusion W Contrast  Result Date: 10/22/2016 CLINICAL DATA:  Initial evaluation for acute aphasia. EXAM: CT CEREBRAL PERFUSION WITH CONTRAST; CT ANGIOGRAPHY NECK; CT ANGIOGRAPHY HEAD TECHNIQUE: Multidetector CT imaging of the neck was performed during bolus injection of intravenous contrast. Multiplanar CT angiographic image reconstructions including MIPs were generated to evaluate the extra-cranial carotid arteries.; Multidetector CT imaging of the brain was performed during bolus injection of intravenous contrast. Multiplanar CT angiographic image reconstructions including MIPs were generated to evaluate cerebral vasculature centered at the [place] of [place].  CONTRAST:  50 cc of Isovue 370. COMPARISON:  Prior noncontrast head CT performed earlier the same day. FINDINGS: CTA NECK Aortic arch: Visualized aortic arch is of normal caliber with normal 3 vessel morphology. No high-grade stenosis seen at the origin of the great vessels. Visualized subclavian arteries are widely patent and unremarkable. Right carotid system: Right common carotid artery widely patent from its origin to the bifurcation. Mild a centric calcified plaque about the right bifurcation without flow-limiting stenosis. Right ICA widely patent distally to the skullbase without stenosis, dissection, or occlusion. Right external carotid artery and its branches grossly normal. Left carotid system: Left common carotid artery widely patent from its origin to the bifurcation. Mild noncalcified atheromatous plaque about the left bifurcation without significant stenosis. Left ICA widely patent from the bifurcation to the skullbase. No stenosis, occlusion, or evidence for dissection within the left carotid artery system. Vertebral arteries: Both vertebral arteries arise from the subclavian arteries. Vertebral arteries patent within the neck without stenosis, dissection, or occlusion. Skeleton: No acute osseous abnormality. No worrisome lytic or blastic osseous lesions. Moderate to advanced degenerative spondylolysis present at C4-5 through C6-7. Soft tissues of the neck: No acute soft tissue abnormality identified within the neck. Thyroid normal. No adenopathy. Upper chest: Enlarged mediastinal lymph nodes noted, measuring up to 15 mm in short access, indeterminate. Emphysematous changes noted within the visualized lungs. Scatter peribronchial thickening noted. 7 mm nodule at the posterior right upper lobe (series 501, image 22). CTA HEAD Anterior circulation: The petrous segments are widely patent bilaterally. Mild atheromatous plaque within the cavernous left ICA without significant stenosis. Cavernous and  supraclinoid ICAs otherwise widely patent without flow-limiting stenosis. A1 segments widely patent. Anterior communicating artery normal. Anterior cerebral arteries well opacified to their distal aspects. M1 segments patent without stenosis or occlusion. MCA bifurcations within normal limits. No proximal M2 occlusion. There is an apparent focal severe proximal M2 stenosis on the left just prior to distal M3 branches (series 502, image 284 on axial sequence, series 504, image 173 on sagittal sequence). Contrast is seen distally within M3 branches. Distal MCA branches otherwise well opacified and symmetric. Posterior circulation: Vertebral arteries patent to the vertebrobasilar junction. Focal plaque within the left V4 segment with mild short-segment stenosis. Posterior inferior cerebral arteries are patent bilaterally. Basilar artery tortuous but widely patent to its distal aspect. Superior cerebellar arteries well opacified bilaterally. Both of the posterior cerebral artery supplied via the basilar artery and are well per few to the distal aspects. Venous sinuses: Patent.  No evidence for venous sinus thrombosis. Anatomic variants: No  significant anatomic variant. No aneurysm or vascular malformation. Delayed phase:  Not performed. CT PERFUSION: CT perfusion was performed, however, no diagnostic images were obtained. There is question of the perfusion scan true bearing off correct intracranial arterial vasculature, as the arterial and venous perfusion grafts closely mimic each other. No appreciable perfusion mismatch is observed. IMPRESSION: CTA NECK IMPRESSION: 1. Mild atheromatous plaque about the carotid bifurcations bilaterally without flow limiting stenosis. No high-grade or correctable stenosis identified within either carotid artery system. 2. Patent vertebral arteries within the neck. 3. Enlarged mediastinal adenopathy as above, indeterminate. Further evaluation with dedicated CT of the chest suggested for  complete evaluation. 4. Emphysema. 5. **An incidental finding of potential clinical significance has been found. 7 mm right upper lobe pulmonary nodule. Non-contrast chest CT at 6-12 months is recommended. If the nodule is stable at time of repeat CT, then future CT at 18-24 months (from today's scan) is considered optional for low-risk patients, but is recommended for high-risk patients. This recommendation follows the consensus statement: Guidelines for Management of Incidental Pulmonary Nodules Detected on CT Images: From the Fleischner Society 2017; Radiology 2017; 284:228-243.** CTA HEAD IMPRESSION: 1. Negative CTA for emergent large vessel occlusion. No correctable stenosis identified. 2. Probable short-segment severe distal left M2 stenosis as above. No other high-grade or critical stenosis within the intracranial circulation. 3. Mild atheromatous plaque within the carotid siphons bilaterally with mild stenosis. 4. Focal plaque within the left V4 segment with short-segment mild stenosis. CT PERFUSION IMPRESSION: Nondiagnostic CT perfusion due to technical error as detailed above. Critical Value/emergent results were discussed by telephone at the time of interpretation on 10/22/2016 at approximately the 10:30 pm to Dr. Ritta Slot , who verbally acknowledged these results. Electronically Signed   By: Rise Mu M.D.   On: 10/22/2016 23:40   Dg Chest Port 1 View  Result Date: 10/23/2016 CLINICAL DATA:  Gurgling breath sounds EXAM: PORTABLE CHEST 1 VIEW COMPARISON:  None. FINDINGS: There is mild cardiomegaly. There is central vascular congestion. Hazy right greater than left edema or infiltrates. No large effusion. No pneumothorax. Degenerative changes of the AC joints. IMPRESSION: Mild cardiomegaly with central vascular congestion; hazy right greater than left pulmonary opacities, favor edema with infection not excluded. Electronically Signed   By: Jasmine Pang M.D.   On: 10/23/2016  03:25   Ct Head Code Stroke Wo Contrast  Result Date: 10/22/2016 CLINICAL DATA:  Code stroke. Altered mental status. Difficulty speaking. EXAM: CT HEAD WITHOUT CONTRAST TECHNIQUE: Contiguous axial images were obtained from the base of the skull through the vertex without intravenous contrast. COMPARISON:  None. FINDINGS: Brain: No evidence of acute infarction, hemorrhage, hydrocephalus, extra-axial collection or mass lesion/mass effect. Patchy low-density in the cerebral white matter, usually chronic microvascular disease. Vascular: No hyperdense vessel. Atherosclerotic calcifications mainly in the carotid siphons. Skull: Negative Sinuses/Orbits: Negative Other: Page at the time of interpretation on 10/22/2016 at 3:17 pm to Dr. Roxy Manns. Will addend once communicated verbally. ASPECTS [facility] Stroke Program Early CT Score) - Ganglionic level infarction (caudate, lentiform nuclei, internal capsule, insula, M1-M3 cortex): 7 - Supraganglionic infarction (M4-M6 cortex): 3 Total score (0-10 with 10 being normal): 10 IMPRESSION: 1. No acute finding. ASPECTS is 10. 2. Moderate white matter disease, likely chronic small vessel ischemia. Electronically Signed   By: Marnee Spring M.D.   On: 10/22/2016 15:20    Assessment/Plan: Diagnosis: Left CVA Labs and images independently reviewed.  Records reviewed and summated above. Stroke: Continue secondary stroke prophylaxis and Risk Factor  Modification listed below:   Antiplatelet therapy:   Blood Pressure Management:  Continue current medication with prn's with permisive HTN per primary team Statin Agent:   Tobacco abuse:   ?hemiparesis  1. Does the need for close, 24 hr/day medical supervision in concert with the patient's rehab needs make it unreasonable for this patient to be served in a less intensive setting? Potentially  2. Co-Morbidities requiring supervision/potential complications: HTN (monitor and provide prns in accordance with increased physical  exertion and pain, transition to orals when appropriate), tobacco abuse (counsel), right upper lobe pulmonary nodule (follow up as outpt), HLD (cont meds), ?dysphagia (unsafe for diet given lack of stability) 3. Due to safety, disease management, medication administration and patient education, does the patient require 24 hr/day rehab nursing? Potentially 4. Does the patient require coordinated care of a physician, rehab nurse, PT (1-2 hrs/day, 5 days/week), OT (1-2 hrs/day, 5 days/week) and SLP (1-2 hrs/day, 5 days/week) to address physical and functional deficits in the context of the above medical diagnosis(es)? Potentially Addressing deficits in the following areas: balance, endurance, locomotion, strength, transferring, bathing, dressing, toileting, cognition, speech, swallowing and psychosocial support 5. Can the patient actively participate in an intensive therapy program of at least 3 hrs of therapy per day at least 5 days per week? Potentially 6. The potential for patient to make measurable gains while on inpatient rehab is TBD 7. Anticipated functional outcomes upon discharge from inpatient rehab are modified independent and supervision  with PT, modified independent and supervision with OT, modified independent with SLP. 8. Estimated rehab length of stay to reach the above functional goals is: 12-15 days. 9. Does the patient have adequate social supports and living environment to accommodate these discharge functional goals? Potentially 10. Anticipated D/C setting: Home 11. Anticipated post D/C treatments: HH therapy and Home excercise program 12. Overall Rehab/Functional Prognosis: good  RECOMMENDATIONS: This patient's condition is appropriate for continued rehabilitative care in the following setting: Pt appears to have waxing and waning symptoms.  Will await completion of workup.  Based on evaluation, pt will not likely require CIR, however, during therapy session 2 hours prior pt was  unresponsive, per report.  Will also need to clarify caregiver support at discharge.  Patient has agreed to participate in recommended program. Potentially Note that insurance prior authorization may be required for reimbursement for recommended care.  Comment: Rehab Admissions Coordinator to follow up.  Maryla Morrow, MD, [place] 10/23/2016

## 2016-10-23 NOTE — Evaluation (Signed)
Physical Therapy Evaluation Patient Details Name: Ronald Terry MRN: [id number] DOB: [birth year] Today's Date: 10/23/2016   History of Present Illness  Ronald Terry is an 65 y.o. admitted after difficulty with gate and unable to speak.. CT head showed no acute stroke or bleed. MRI pending. CXR mild cardiomegaly with central vascular congestion; hazy right greater than left pulmonary opacities, favor edema with infection . Pending stat MRI  Clinical Impression  PT admitted with pending CVA workup underway. Pt with decr arousal, incontinence of urine and stool and decreased left sided movement during bed level asessment and hygiene care. RN and MD called immediately to room to assess. Pt currently with functional limitiations due to the deficits listed below. PTA was independent with all adls and drives. .  Pt will benefit from skilled PT to address deficits and maximize function. Will see as indicated and progress as tolerated. Recommend CIR consult.    Follow Up Recommendations CIR;Supervision/Assistance - 24 hour    Equipment Recommendations   (TBD)    Recommendations for Other Services Rehab consult     Precautions / Restrictions Precautions Precautions: Fall      Mobility  Bed Mobility Overal bed mobility: Needs Assistance Bed Mobility: Rolling Rolling: +2 for physical assistance;Max assist         General bed mobility comments: Pt log R and L to complete peri care. Pt no initation at this time. flaccid L UE   Transfers                 General transfer comment: n/a this time  Ambulation/Gait                Stairs            Wheelchair Mobility    Modified Rankin (Stroke Patients Only) Modified Rankin (Stroke Patients Only) Pre-Morbid Rankin Score: No symptoms Modified Rankin: Severe disability     Balance Overall balance assessment:  (unable to assess)                                           Pertinent Vitals/Pain  Pain Assessment: No/denies pain    Home Living Family/patient expects to be discharged to:: Private residence Living Arrangements: Other relatives (lives with brother) Available Help at Discharge: Family;Available 24 hours/day Type of Home: House Home Access: Stairs to enter   [employer] of Steps: 5 Home Layout: One level Home Equipment: None Additional Comments: drives     Prior Function Level of Independence: Independent               Hand Dominance   Dominant Hand: Right    Extremity/Trunk Assessment   Upper Extremity Assessment: LUE deficits/detail       LUE Deficits / Details: flaccid with no response , unable to hold against gravity. reaching with R UE for L UE    Lower Extremity Assessment: LLE deficits/detail   LLE Deficits / Details: flaccid initially during session. pt does demonstrate knee flexion toward end of session wth Dr Roda Shutters present evaluating patient     Communication   Communication: HOH;Expressive difficulties  Cognition Arousal/Alertness: Lethargic Behavior During Therapy: Flat affect Overall Cognitive Status: Impaired/Different from baseline                      General Comments General comments (skin integrity, edema, etc.): wound on L buttock size of a  dime    Exercises     Assessment/Plan    PT Assessment Patient needs continued PT services  PT Problem List Decreased strength;Decreased activity tolerance;Decreased balance;Decreased mobility;Decreased coordination;Impaired sensation;Impaired tone;Decreased safety awareness;Decreased cognition          PT Treatment Interventions DME instruction;Gait training;Functional mobility training;Therapeutic activities;Therapeutic exercise;Balance training;Neuromuscular re-education;Cognitive remediation;Patient/family education    PT Goals (Current goals can be found in the Care Plan section)  Acute Rehab PT Goals Patient Stated Goal: none stated PT Goal Formulation:  Patient unable to participate in goal setting Time For Goal Achievement: 11/06/16 Potential to Achieve Goals: Fair    Frequency Min 3X/week   Barriers to discharge        Co-evaluation               End of Session   Activity Tolerance: Treatment limited secondary to medical complications (Comment) (sudden onset of left sided flaccidity) Patient left: in bed;with call bell/phone within reach;with nursing/sitter in room;with family/visitor present (MD at bedside) Nurse Communication: Mobility status         Time: [id number] PT Time Calculation (min) (ACUTE ONLY): 26 min   Charges:   PT Evaluation $PT Eval Moderate Complexity: 1 Procedure     PT G CodesFabio Terry:        Ronald Terry 10/23/2016, 11:12 AM  Ronald Terry, PT DPT  [phone]

## 2016-10-23 NOTE — Progress Notes (Signed)
Rehab Admissions Coordinator Note:  Patient was screened by Trish MageLogue, Lalo Tromp M for appropriateness for an Inpatient Acute Rehab Consult.  At this time, we are recommending Inpatient Rehab consult.  Trish MageLogue, Syanna Remmert M 10/23/2016, 11:47 AM  I can be reached at [phone].

## 2016-10-23 NOTE — Progress Notes (Signed)
Dr. Roda ShuttersXu at bedside to assess patient. Continuing to monitor.

## 2016-10-23 NOTE — Progress Notes (Signed)
Initial Nutrition Assessment  INTERVENTION:   Ensure Enlive po BID, each supplement provides 350 kcal and 20 grams of protein   NUTRITION DIAGNOSIS:   Inadequate oral intake related to lethargy/confusion as evidenced by  (per RN).  GOAL:   Patient will meet greater than or equal to 90% of their needs  MONITOR:   PO intake, Diet advancement, Supplement acceptance, I & O's  REASON FOR ASSESSMENT:   Malnutrition Screening Tool    ASSESSMENT:   Pt with hx of HTN and smoking who was admitted with right sided weakness and aphasia. CT shows no acute stroke, MRI pending. Per neuro suspect distal cortical area of ischemia which is BP dependant.    Pt discussed during ICU rounds and with RN.  Per RN pt passed swallow eval but was more lethargic after so she has held his meals for now. EEG just completed but was normal.  Pt unable to answer specific questions about his weight or usual intake. Pt lives with brother per RN. Son at bedside and reports his dad looks the same to him without weight changes.   Medications reviewed Labs reviewed: LDL 127 Nutrition-Focused physical exam completed. Findings are no fat depletion, no muscle depletion, and no edema.     Diet Order:  Diet NPO time specified  Skin:  Reviewed, no issues  Last BM:  10/22  Height:   Ht Readings from Last 1 Encounters:  10/22/16 5\' 9"  (1.753 m)    Weight:   Wt Readings from Last 1 Encounters:  10/22/16 199 lb 4.7 oz (90.4 kg)    Ideal Body Weight:  72.7 kg  BMI:  Body mass index is 29.43 kg/m.  Estimated Nutritional Needs:   Kcal:  2100-2300  Protein:  108-120 grams  Fluid:  > 2.1 L/day  EDUCATION NEEDS:   No education needs identified at this time  Kendell BaneHeather Zachariah Pavek RD, LDN, CNSC [phone] Pager [phone] After Hours Pager

## 2016-10-23 NOTE — ED Provider Notes (Signed)
[facility]-EMERGENCY DEPT Provider Note   CSN: [id number] Arrival date & time: 10/22/16  1458     History   Chief Complaint Chief Complaint  Patient presents with  . Code Stroke    HPI Ronald Terry is a 65 y.o. male.  HPI   Level V caveat, pt nonverbal  65yo male presents with concern for aphasia. Last known normal 1245 while talking on phone. Patient unable to speak, question of right facial droop. No other known acute illness.  Past Medical History:  Diagnosis Date  . HTN (hypertension)   . Smoking     Patient Active Problem List   Diagnosis Date Noted  . Benign essential HTN   . Tobacco abuse   . Solitary pulmonary nodule   . Dysphagia, post-stroke   . Unresponsiveness   . 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 No pertinent surgical history.     Home Medications    Prior to Admission medications   Medication Sig Start Date End Date Taking? Authorizing Provider  acetaminophen (TYLENOL) 500 MG tablet Take 1,500 mg by mouth every 6 (six) hours as needed for moderate pain or headache.   Yes Historical Provider, MD    Family History History reviewed. No pertinent family history.  Social History Social History  Substance Use Topics  . Smoking status: Current Every Day Smoker    Packs/day: 2.00    Years: 25.00    Types: Cigarettes  . Smokeless tobacco: Never Used  . Alcohol use Yes     Comment: occ     Allergies   Review of patient's allergies indicates no known allergies.   Review of Systems Review of Systems  Unable to perform ROS: Patient nonverbal     Physical Exam Updated Vital Signs BP 123/81   Pulse 92   Temp 97.3 F (36.3 C) (Oral)   Resp 18   Ht 5\' 9"  (1.753 m)   Wt 199 lb 4.7 oz (90.4 kg)   SpO2 99%   BMI 29.43 kg/m   Physical Exam  Constitutional: He appears well-developed and well-nourished. No distress.  HENT:  Head: Normocephalic and atraumatic.  Eyes: Conjunctivae and EOM are normal.  Neck: Normal  range of motion.  Cardiovascular: Normal rate, regular rhythm, normal heart sounds and intact distal pulses.  Exam reveals no gallop and no friction rub.   No murmur heard. Pulmonary/Chest: Effort normal and breath sounds normal. No respiratory distress. He has no wheezes. He has no rales.  Abdominal: Soft. He exhibits no distension. There is no tenderness. There is no guarding.  Musculoskeletal: He exhibits no edema.  Neurological: He is alert.  Dense aphasia, can answer some yes/no, unable to say anything more, stuttering and frustration when asked questions, mild right sided facial droop, do not detect weakness on exam, intermittently able to follow commands, coordination appears normal, sensation difficult to assess but appears normal, normal EOM  Skin: Skin is warm and dry. He is not diaphoretic.  Nursing note and vitals reviewed.    ED Treatments / Results  Labs (all labs ordered are listed, but only abnormal results are displayed) Labs Reviewed  COMPREHENSIVE METABOLIC PANEL - Abnormal; Notable for the following:       Result Value   Total Bilirubin 1.7 (*)    All other components within normal limits  URINALYSIS, ROUTINE W REFLEX MICROSCOPIC (NOT AT [facility]) - Abnormal; Notable for the following:    Hgb urine dipstick SMALL (*)    Ketones, ur 15 (*)  Protein, ur 100 (*)    All other components within normal limits  LIPID PANEL - Abnormal; Notable for the following:    LDL Cholesterol 127 (*)    All other components within normal limits  URINE MICROSCOPIC-ADD ON - Abnormal; Notable for the following:    Squamous Epithelial / LPF 0-5 (*)    Bacteria, UA FEW (*)    Casts GRANULAR CAST (*)    All other components within normal limits  I-STAT CHEM 8, ED - Abnormal; Notable for the following:    Calcium, Ion 1.04 (*)    All other components within normal limits  MRSA PCR SCREENING  ETHANOL  PROTIME-INR  APTT  CBC  DIFFERENTIAL  RAPID URINE DRUG SCREEN, HOSP PERFORMED    HEMOGLOBIN A1C  CBC  BASIC METABOLIC PANEL  I-STAT TROPOININ, ED    EKG  EKG Interpretation  Date/Time:  Monday October 22 2016 15:17:55 EDT Ventricular Rate:  101 PR Interval:    QRS Duration: 149 QT Interval:  366 QTC Calculation: 475 R Axis:   -77 Text Interpretation:  Sinus tachycardia Paired ventricular premature complexes Left atrial enlargement Nonspecific IVCD with LAD LVH with secondary repolarization abnormality Inferior infarct, acute Anterior infarct, acute (LAD) No previous ECGs available Confirmed by LITTLE MD, RACHEL ([id number]) on 10/22/2016 3:25:25 PM Also confirmed by LITTLE MD, RACHEL [phone]), editor [place], [place] [phone])  on 10/22/2016 3:53:18 PM       Radiology Ct Angio Head W Or Wo Contrast  Result Date: 10/22/2016 CLINICAL DATA:  Initial evaluation for acute aphasia. EXAM: CT CEREBRAL PERFUSION WITH CONTRAST; CT ANGIOGRAPHY NECK; CT ANGIOGRAPHY HEAD TECHNIQUE: Multidetector CT imaging of the neck was performed during bolus injection of intravenous contrast. Multiplanar CT angiographic image reconstructions including MIPs were generated to evaluate the extra-cranial carotid arteries.; Multidetector CT imaging of the brain was performed during bolus injection of intravenous contrast. Multiplanar CT angiographic image reconstructions including MIPs were generated to evaluate cerebral vasculature centered at the Circle of [place]. CONTRAST:  50 cc of Isovue 370. COMPARISON:  Prior noncontrast head CT performed earlier the same day. FINDINGS: CTA NECK Aortic arch: Visualized aortic arch is of normal caliber with normal 3 vessel morphology. No high-grade stenosis seen at the origin of the great vessels. Visualized subclavian arteries are widely patent and unremarkable. Right carotid system: Right common carotid artery widely patent from its origin to the bifurcation. Mild a centric calcified plaque about the right bifurcation without flow-limiting stenosis. Right ICA widely  patent distally to the skullbase without stenosis, dissection, or occlusion. Right external carotid artery and its branches grossly normal. Left carotid system: Left common carotid artery widely patent from its origin to the bifurcation. Mild noncalcified atheromatous plaque about the left bifurcation without significant stenosis. Left ICA widely patent from the bifurcation to the skullbase. No stenosis, occlusion, or evidence for dissection within the left carotid artery system. Vertebral arteries: Both vertebral arteries arise from the subclavian arteries. Vertebral arteries patent within the neck without stenosis, dissection, or occlusion. Skeleton: No acute osseous abnormality. No worrisome lytic or blastic osseous lesions. Moderate to advanced degenerative spondylolysis present at C4-5 through C6-7. Soft tissues of the neck: No acute soft tissue abnormality identified within the neck. Thyroid normal. No adenopathy. Upper chest: Enlarged mediastinal lymph nodes noted, measuring up to 15 mm in short access, indeterminate. Emphysematous changes noted within the visualized lungs. Scatter peribronchial thickening noted. 7 mm nodule at the posterior right upper lobe (series 501, image 22). CTA HEAD Anterior circulation:  The petrous segments are widely patent bilaterally. Mild atheromatous plaque within the cavernous left ICA without significant stenosis. Cavernous and supraclinoid ICAs otherwise widely patent without flow-limiting stenosis. A1 segments widely patent. Anterior communicating artery normal. Anterior cerebral arteries well opacified to their distal aspects. M1 segments patent without stenosis or occlusion. MCA bifurcations within normal limits. No proximal M2 occlusion. There is an apparent focal severe proximal M2 stenosis on the left just prior to distal M3 branches (series 502, image 284 on axial sequence, series 504, image 173 on sagittal sequence). Contrast is seen distally within M3 branches.  Distal MCA branches otherwise well opacified and symmetric. Posterior circulation: Vertebral arteries patent to the vertebrobasilar junction. Focal plaque within the left V4 segment with mild short-segment stenosis. Posterior inferior cerebral arteries are patent bilaterally. Basilar artery tortuous but widely patent to its distal aspect. Superior cerebellar arteries well opacified bilaterally. Both of the posterior cerebral artery supplied via the basilar artery and are well per few to the distal aspects. Venous sinuses: Patent.  No evidence for venous sinus thrombosis. Anatomic variants: No significant anatomic variant. No aneurysm or vascular malformation. Delayed phase:  Not performed. CT PERFUSION: CT perfusion was performed, however, no diagnostic images were obtained. There is question of the perfusion scan true bearing off correct intracranial arterial vasculature, as the arterial and venous perfusion grafts closely mimic each other. No appreciable perfusion mismatch is observed. IMPRESSION: CTA NECK IMPRESSION: 1. Mild atheromatous plaque about the carotid bifurcations bilaterally without flow limiting stenosis. No high-grade or correctable stenosis identified within either carotid artery system. 2. Patent vertebral arteries within the neck. 3. Enlarged mediastinal adenopathy as above, indeterminate. Further evaluation with dedicated CT of the chest suggested for complete evaluation. 4. Emphysema. 5. **An incidental finding of potential clinical significance has been found. 7 mm right upper lobe pulmonary nodule. Non-contrast chest CT at 6-12 months is recommended. If the nodule is stable at time of repeat CT, then future CT at 18-24 months (from today's scan) is considered optional for low-risk patients, but is recommended for high-risk patients. This recommendation follows the consensus statement: Guidelines for Management of Incidental Pulmonary Nodules Detected on CT Images: From the Fleischner Society  2017; Radiology 2017; 284:228-243.** CTA HEAD IMPRESSION: 1. Negative CTA for emergent large vessel occlusion. No correctable stenosis identified. 2. Probable short-segment severe distal left M2 stenosis as above. No other high-grade or critical stenosis within the intracranial circulation. 3. Mild atheromatous plaque within the carotid siphons bilaterally with mild stenosis. 4. Focal plaque within the left V4 segment with short-segment mild stenosis. CT PERFUSION IMPRESSION: Nondiagnostic CT perfusion due to technical error as detailed above. Critical Value/emergent results were discussed by telephone at the time of interpretation on 10/22/2016 at approximately the 10:30 pm to Dr. Ritta Slot , who verbally acknowledged these results. Electronically Signed   By: Rise Mu M.D.   On: 10/22/2016 23:40   Ct Head Wo Contrast  Result Date: 10/23/2016 CLINICAL DATA:  65 year old male with elevated blood pressure. Increased confusion and slurred speech. Subsequent encounter. EXAM: CT HEAD WITHOUT CONTRAST TECHNIQUE: Contiguous axial images were obtained from the base of the skull through the vertex without intravenous contrast. COMPARISON:  CT angiogram and CT head 10/22/2012. FINDINGS: Brain: No intracranial hemorrhage or CT evidence of large acute infarct. Mild to moderate chronic microvascular changes. No hydrocephalus. No intracranial mass lesion noted on this unenhanced exam. Partially empty expanded sella. No second very findings of pseudotumor. Vascular: Mild plaque without hyperdense vessel. Skull: No acute  abnormality. Sinuses/Orbits: Mild exophthalmos. Sinuses which are visualized are clear. Other: Negative IMPRESSION: No intracranial hemorrhage or CT evidence of large acute infarct. Mild to moderate chronic microvascular changes. Partially empty expanded sella. Electronically Signed   By: Lacy Duverney M.D.   On: 10/23/2016 10:39   Ct Angio Neck W Or Wo Contrast  Result Date:  10/22/2016 CLINICAL DATA:  Initial evaluation for acute aphasia. EXAM: CT CEREBRAL PERFUSION WITH CONTRAST; CT ANGIOGRAPHY NECK; CT ANGIOGRAPHY HEAD TECHNIQUE: Multidetector CT imaging of the neck was performed during bolus injection of intravenous contrast. Multiplanar CT angiographic image reconstructions including MIPs were generated to evaluate the extra-cranial carotid arteries.; Multidetector CT imaging of the brain was performed during bolus injection of intravenous contrast. Multiplanar CT angiographic image reconstructions including MIPs were generated to evaluate cerebral vasculature centered at the Circle of [place]. CONTRAST:  50 cc of Isovue 370. COMPARISON:  Prior noncontrast head CT performed earlier the same day. FINDINGS: CTA NECK Aortic arch: Visualized aortic arch is of normal caliber with normal 3 vessel morphology. No high-grade stenosis seen at the origin of the great vessels. Visualized subclavian arteries are widely patent and unremarkable. Right carotid system: Right common carotid artery widely patent from its origin to the bifurcation. Mild a centric calcified plaque about the right bifurcation without flow-limiting stenosis. Right ICA widely patent distally to the skullbase without stenosis, dissection, or occlusion. Right external carotid artery and its branches grossly normal. Left carotid system: Left common carotid artery widely patent from its origin to the bifurcation. Mild noncalcified atheromatous plaque about the left bifurcation without significant stenosis. Left ICA widely patent from the bifurcation to the skullbase. No stenosis, occlusion, or evidence for dissection within the left carotid artery system. Vertebral arteries: Both vertebral arteries arise from the subclavian arteries. Vertebral arteries patent within the neck without stenosis, dissection, or occlusion. Skeleton: No acute osseous abnormality. No worrisome lytic or blastic osseous lesions. Moderate to advanced  degenerative spondylolysis present at C4-5 through C6-7. Soft tissues of the neck: No acute soft tissue abnormality identified within the neck. Thyroid normal. No adenopathy. Upper chest: Enlarged mediastinal lymph nodes noted, measuring up to 15 mm in short access, indeterminate. Emphysematous changes noted within the visualized lungs. Scatter peribronchial thickening noted. 7 mm nodule at the posterior right upper lobe (series 501, image 22). CTA HEAD Anterior circulation: The petrous segments are widely patent bilaterally. Mild atheromatous plaque within the cavernous left ICA without significant stenosis. Cavernous and supraclinoid ICAs otherwise widely patent without flow-limiting stenosis. A1 segments widely patent. Anterior communicating artery normal. Anterior cerebral arteries well opacified to their distal aspects. M1 segments patent without stenosis or occlusion. MCA bifurcations within normal limits. No proximal M2 occlusion. There is an apparent focal severe proximal M2 stenosis on the left just prior to distal M3 branches (series 502, image 284 on axial sequence, series 504, image 173 on sagittal sequence). Contrast is seen distally within M3 branches. Distal MCA branches otherwise well opacified and symmetric. Posterior circulation: Vertebral arteries patent to the vertebrobasilar junction. Focal plaque within the left V4 segment with mild short-segment stenosis. Posterior inferior cerebral arteries are patent bilaterally. Basilar artery tortuous but widely patent to its distal aspect. Superior cerebellar arteries well opacified bilaterally. Both of the posterior cerebral artery supplied via the basilar artery and are well per few to the distal aspects. Venous sinuses: Patent.  No evidence for venous sinus thrombosis. Anatomic variants: No significant anatomic variant. No aneurysm or vascular malformation. Delayed phase:  Not performed. CT  PERFUSION: CT perfusion was performed, however, no diagnostic  images were obtained. There is question of the perfusion scan true bearing off correct intracranial arterial vasculature, as the arterial and venous perfusion grafts closely mimic each other. No appreciable perfusion mismatch is observed. IMPRESSION: CTA NECK IMPRESSION: 1. Mild atheromatous plaque about the carotid bifurcations bilaterally without flow limiting stenosis. No high-grade or correctable stenosis identified within either carotid artery system. 2. Patent vertebral arteries within the neck. 3. Enlarged mediastinal adenopathy as above, indeterminate. Further evaluation with dedicated CT of the chest suggested for complete evaluation. 4. Emphysema. 5. **An incidental finding of potential clinical significance has been found. 7 mm right upper lobe pulmonary nodule. Non-contrast chest CT at 6-12 months is recommended. If the nodule is stable at time of repeat CT, then future CT at 18-24 months (from today's scan) is considered optional for low-risk patients, but is recommended for high-risk patients. This recommendation follows the consensus statement: Guidelines for Management of Incidental Pulmonary Nodules Detected on CT Images: From the Fleischner Society 2017; Radiology 2017; 284:228-243.** CTA HEAD IMPRESSION: 1. Negative CTA for emergent large vessel occlusion. No correctable stenosis identified. 2. Probable short-segment severe distal left M2 stenosis as above. No other high-grade or critical stenosis within the intracranial circulation. 3. Mild atheromatous plaque within the carotid siphons bilaterally with mild stenosis. 4. Focal plaque within the left V4 segment with short-segment mild stenosis. CT PERFUSION IMPRESSION: Nondiagnostic CT perfusion due to technical error as detailed above. Critical Value/emergent results were discussed by telephone at the time of interpretation on 10/22/2016 at approximately the 10:30 pm to Dr. Ritta SlotMCNEILL KIRKPATRICK , who verbally acknowledged these results.  Electronically Signed   By: Rise MuBenjamin  McClintock M.D.   On: 10/22/2016 23:40   Ct Cerebral Perfusion W Contrast  Result Date: 10/22/2016 CLINICAL DATA:  Initial evaluation for acute aphasia. EXAM: CT CEREBRAL PERFUSION WITH CONTRAST; CT ANGIOGRAPHY NECK; CT ANGIOGRAPHY HEAD TECHNIQUE: Multidetector CT imaging of the neck was performed during bolus injection of intravenous contrast. Multiplanar CT angiographic image reconstructions including MIPs were generated to evaluate the extra-cranial carotid arteries.; Multidetector CT imaging of the brain was performed during bolus injection of intravenous contrast. Multiplanar CT angiographic image reconstructions including MIPs were generated to evaluate cerebral vasculature centered at the [place]. CONTRAST:  50 cc of Isovue 370. COMPARISON:  Prior noncontrast head CT performed earlier the same day. FINDINGS: CTA NECK Aortic arch: Visualized aortic arch is of normal caliber with normal 3 vessel morphology. No high-grade stenosis seen at the origin of the great vessels. Visualized subclavian arteries are widely patent and unremarkable. Right carotid system: Right common carotid artery widely patent from its origin to the bifurcation. Mild a centric calcified plaque about the right bifurcation without flow-limiting stenosis. Right ICA widely patent distally to the skullbase without stenosis, dissection, or occlusion. Right external carotid artery and its branches grossly normal. Left carotid system: Left common carotid artery widely patent from its origin to the bifurcation. Mild noncalcified atheromatous plaque about the left bifurcation without significant stenosis. Left ICA widely patent from the bifurcation to the skullbase. No stenosis, occlusion, or evidence for dissection within the left carotid artery system. Vertebral arteries: Both vertebral arteries arise from the subclavian arteries. Vertebral arteries patent within the neck without stenosis,  dissection, or occlusion. Skeleton: No acute osseous abnormality. No worrisome lytic or blastic osseous lesions. Moderate to advanced degenerative spondylolysis present at C4-5 through C6-7. Soft tissues of the neck: No acute soft tissue abnormality identified within the  neck. Thyroid normal. No adenopathy. Upper chest: Enlarged mediastinal lymph nodes noted, measuring up to 15 mm in short access, indeterminate. Emphysematous changes noted within the visualized lungs. Scatter peribronchial thickening noted. 7 mm nodule at the posterior right upper lobe (series 501, image 22). CTA HEAD Anterior circulation: The petrous segments are widely patent bilaterally. Mild atheromatous plaque within the cavernous left ICA without significant stenosis. Cavernous and supraclinoid ICAs otherwise widely patent without flow-limiting stenosis. A1 segments widely patent. Anterior communicating artery normal. Anterior cerebral arteries well opacified to their distal aspects. M1 segments patent without stenosis or occlusion. MCA bifurcations within normal limits. No proximal M2 occlusion. There is an apparent focal severe proximal M2 stenosis on the left just prior to distal M3 branches (series 502, image 284 on axial sequence, series 504, image 173 on sagittal sequence). Contrast is seen distally within M3 branches. Distal MCA branches otherwise well opacified and symmetric. Posterior circulation: Vertebral arteries patent to the vertebrobasilar junction. Focal plaque within the left V4 segment with mild short-segment stenosis. Posterior inferior cerebral arteries are patent bilaterally. Basilar artery tortuous but widely patent to its distal aspect. Superior cerebellar arteries well opacified bilaterally. Both of the posterior cerebral artery supplied via the basilar artery and are well per few to the distal aspects. Venous sinuses: Patent.  No evidence for venous sinus thrombosis. Anatomic variants: No significant anatomic variant.  No aneurysm or vascular malformation. Delayed phase:  Not performed. CT PERFUSION: CT perfusion was performed, however, no diagnostic images were obtained. There is question of the perfusion scan true bearing off correct intracranial arterial vasculature, as the arterial and venous perfusion grafts closely mimic each other. No appreciable perfusion mismatch is observed. IMPRESSION: CTA NECK IMPRESSION: 1. Mild atheromatous plaque about the carotid bifurcations bilaterally without flow limiting stenosis. No high-grade or correctable stenosis identified within either carotid artery system. 2. Patent vertebral arteries within the neck. 3. Enlarged mediastinal adenopathy as above, indeterminate. Further evaluation with dedicated CT of the chest suggested for complete evaluation. 4. Emphysema. 5. **An incidental finding of potential clinical significance has been found. 7 mm right upper lobe pulmonary nodule. Non-contrast chest CT at 6-12 months is recommended. If the nodule is stable at time of repeat CT, then future CT at 18-24 months (from today's scan) is considered optional for low-risk patients, but is recommended for high-risk patients. This recommendation follows the consensus statement: Guidelines for Management of Incidental Pulmonary Nodules Detected on CT Images: From the Fleischner Society 2017; Radiology 2017; 284:228-243.** CTA HEAD IMPRESSION: 1. Negative CTA for emergent large vessel occlusion. No correctable stenosis identified. 2. Probable short-segment severe distal left M2 stenosis as above. No other high-grade or critical stenosis within the intracranial circulation. 3. Mild atheromatous plaque within the carotid siphons bilaterally with mild stenosis. 4. Focal plaque within the left V4 segment with short-segment mild stenosis. CT PERFUSION IMPRESSION: Nondiagnostic CT perfusion due to technical error as detailed above. Critical Value/emergent results were discussed by telephone at the time of  interpretation on 10/22/2016 at approximately the 10:30 pm to Dr. Ritta Slot , who verbally acknowledged these results. Electronically Signed   By: Rise Mu M.D.   On: 10/22/2016 23:40   Dg Chest Port 1 View  Result Date: 10/23/2016 CLINICAL DATA:  Gurgling breath sounds EXAM: PORTABLE CHEST 1 VIEW COMPARISON:  None. FINDINGS: There is mild cardiomegaly. There is central vascular congestion. Hazy right greater than left edema or infiltrates. No large effusion. No pneumothorax. Degenerative changes of the AC joints. IMPRESSION: Mild  cardiomegaly with central vascular congestion; hazy right greater than left pulmonary opacities, favor edema with infection not excluded. Electronically Signed   By: Jasmine Pang M.D.   On: 10/23/2016 03:25   Ct Head Code Stroke Wo Contrast  Result Date: 10/22/2016 CLINICAL DATA:  Code stroke. Altered mental status. Difficulty speaking. EXAM: CT HEAD WITHOUT CONTRAST TECHNIQUE: Contiguous axial images were obtained from the base of the skull through the vertex without intravenous contrast. COMPARISON:  None. FINDINGS: Brain: No evidence of acute infarction, hemorrhage, hydrocephalus, extra-axial collection or mass lesion/mass effect. Patchy low-density in the cerebral white matter, usually chronic microvascular disease. Vascular: No hyperdense vessel. Atherosclerotic calcifications mainly in the carotid siphons. Skull: Negative Sinuses/Orbits: Negative Other: Page at the time of interpretation on 10/22/2016 at 3:17 pm to Dr. Roxy Manns. Will addend once communicated verbally. ASPECTS [facility] Stroke Program Early CT Score) - Ganglionic level infarction (caudate, lentiform nuclei, internal capsule, insula, M1-M3 cortex): 7 - Supraganglionic infarction (M4-M6 cortex): 3 Total score (0-10 with 10 being normal): 10 IMPRESSION: 1. No acute finding. ASPECTS is 10. 2. Moderate white matter disease, likely chronic small vessel ischemia. Electronically Signed   By:  Marnee Spring M.D.   On: 10/22/2016 15:20    Procedures Procedures (including critical care time)  Medications Ordered in ED Medications  acetaminophen (TYLENOL) tablet 650 mg (not administered)    Or  acetaminophen (TYLENOL) suppository 650 mg (not administered)  senna-docusate (Senokot-S) tablet 1 tablet (not administered)  famotidine (PEPCID) IVPB 20 mg premix (20 mg Intravenous Given 10/23/16 0916)  nicardipine (CARDENE) 20mg  in 0.86% saline IV infusion (0.1 mg/ml) (not administered)  Influenza vac split quadrivalent PF (FLUARIX) injection 0.5 mL (not administered)  feeding supplement (ENSURE ENLIVE) (ENSURE ENLIVE) liquid 237 mL (237 mLs Oral Given 10/23/16 0916)  labetalol (NORMODYNE,TRANDATE) injection 10 mg (not administered)  0.9 %  sodium chloride infusion ( Intravenous New Bag/Given 10/23/16 1224)  albumin human 5 % solution 12.5 g (12.5 g Intravenous Given 10/23/16 1618)   stroke: mapping our early stages of recovery book ( Does not apply Given 10/22/16 1811)  alteplase (ACTIVASE) 1 mg/mL infusion 85 mg (85 mg Intravenous New Bag/Given 10/22/16 1526)  pneumococcal 23 valent vaccine (PNU-IMMUNE) injection 0.5 mL (0.5 mLs Intramuscular Given 10/23/16 1230)  iopamidol (ISOVUE-370) 76 % injection (100 mLs  Contrast Given 10/22/16 2122)  furosemide (LASIX) injection 10 mg (10 mg Intravenous Given 10/23/16 0256)  0.9 %  sodium chloride infusion (500 mLs Intravenous Bolus from Bag 10/22/16 2100)  0.9 %  sodium chloride infusion (500 mLs Intravenous New Bag/Given 10/23/16 1130)     Initial Impression / Assessment and Plan / ED Course  I have reviewed the triage vital signs and the nursing notes.  Pertinent labs & imaging results that were available during my care of the patient were reviewed by me and considered in my medical decision making (see chart for details). CRITICAL CARE:stroke TPA Performed by: Rhae Lerner   Total critical care time: 30  minutes  Critical care time was exclusive of separately billable procedures and treating other patients.  Critical care was necessary to treat or prevent imminent or life-threatening deterioration.  Critical care was time spent personally by me on the following activities: development of treatment plan with patient and/or surrogate as well as nursing, discussions with consultants, evaluation of patient's response to treatment, examination of patient, obtaining history from patient or surrogate, ordering and performing treatments and interventions, ordering and review of laboratory studies, ordering and review of  radiographic studies, pulse oximetry and re-evaluation of patient's condition.  Clinical Course   65yo male with hx of htn presents with concern for aphasia.  Patient within tPA window and code stroke called on arrival. CT scan without bleed. Neurology at bedside, dense aphasia, blood pressures 140s systolic, tPA ordered and administered.  Patient admitted to ICU for further care.   Final Clinical Impressions(s) / ED Diagnoses   Final diagnoses:  Aphasia  Cerebrovascular accident (CVA), unspecified mechanism [facility])    New Prescriptions Current Discharge Medication List       Alvira Monday, MD 10/23/16 1719

## 2016-10-23 NOTE — Progress Notes (Signed)
Charge nurse Rulon EisenmengerHeather Satterfield made aware of neuro changes with patient while working with PT. Dr. Roda ShuttersXu paged. Continuing to monitor closely.

## 2016-10-23 NOTE — Progress Notes (Signed)
Patient transported for STAT Head CT. Continuing to monitor closely.

## 2016-10-23 NOTE — Procedures (Signed)
ELECTROENCEPHALOGRAM REPORT  Date of Study: 10/23/2016  Patient's Name: Ronald Terry MRN: [id number] Date of Birth: 1951/10/21  Referring Provider: Marvel PlanJindong Xu, MD  Clinical History: 65 year old man with aphasia, status post tPA  Medications: 0.9 % sodium chloride infusion  0.9 % sodium chloride infusion  acetaminophen (TYLENOL) suppository 650 mg  acetaminophen (TYLENOL) tablet 650 mg  albumin human 5 % solution 12.5 g  famotidine (PEPCID) IVPB 20 mg premix  feeding supplement (ENSURE ENLIVE)(ENSURE ENLIVE) liquid 237 mL  Influenza vac split quadrivalent PF (FLUARIX) injection 0.5 mL  labetalol (NORMODYNE,TRANDATE) injection 10 mg  nicardipine (CARDENE) 20mg  in 0.86% saline 200ml IV infusion (0.1 mg/ml)  pneumococcal 23 valent vaccine (PNU-IMMUNE) injection 0.5 mL  senna-docusate (Senokot-S) tablet 1 tablet     Technical Summary: A multichannel digital EEG recording measured by the international 10-20 system with electrodes applied with paste and impedances below 5000 ohms performed in our laboratory with EKG monitoring in an awake and drowsy patient.  Hyperventilation and photic stimulation were not performed.  The digital EEG was referentially recorded, reformatted, and digitally filtered in a variety of bipolar and referential montages for optimal display.    Description: The patient is awake and drowsy during the recording.  During maximal wakefulness, there is a symmetric, medium voltage 8-9 Hz posterior dominant rhythm that attenuates with eye opening.  The record is symmetric.  During drowsiness and sleep, there is an increase in theta slowing of the background.  Stage II sleep was not seen.  There were no epileptiform discharges or electrographic seizures seen.    EKG lead was unremarkable.  Impression: This awake and drowsy EEG is normal.    Clinical Correlation: A normal EEG does not exclude a clinical diagnosis of epilepsy.  If further clinical questions remain,  prolonged EEG may be helpful.  Clinical correlation is advised.   Shon MilletAdam Victorhugo Preis, DO

## 2016-10-23 NOTE — Progress Notes (Signed)
STROKE TEAM PROGRESS NOTE   HISTORY OF PRESENT ILLNESS (per record) Ronald Terry is an 65 y.o. male who was last talked to in a normal state at 12:50 on 10/22/2016 (LKW) with his daughter. He drove to his daughters house and he was noted to not get out pf his car. She went to his car to check on him and noted he was drooling on the right and not talk. EMS was called and he was brought to the hospital. On arrival he was noted to have right sided weakness and have aphasia. CT head was obtained. CT head showed no acute stroke or bleed. [name] He was admitted to the neuro ICU for further evaluation and treatment.   SUBJECTIVE (INTERVAL HISTORY) Over night, he had worsening aphasia around 10p. He received IV labetaolo for DBP 115. He also received a fluid bolus. Stat CT at that time was unrevealing. His BP and aphasia both improved after CT. BP parameters were increased to 185/120 to trigger treatment.   In am during round, he was doing well still has mild expressive aphasia but more influent speech. Moving all limbs equally. However, around 10:35am, he was talking with OT in bed, he was noticed to have acute mental status change with drowsy sleepy and the flaccid on the left arm and leg. His BP at that time 129/85. Put pt on trendelenburg position and had stat CT head showed no bleeding. After CT, pt again back to previous baseline. Moving all extremities equally. BP at that time 143/100.   Back to ICU, pt blood pressure maintains at 125-130. Request bed rest today, flat bed position, gave IV bolus and continue IVF. Kept NPO, and giving albumin for 4 doses.    OBJECTIVE Temp:  [97.3 F (36.3 C)-99.6 F (37.6 C)] 97.3 F (36.3 C) (10/24 1200) Pulse Rate:  [44-140] 92 (10/24 1200) Resp:  [15-28] 18 (10/24 1200) BP: (117-180)/(81-147) 123/81 (10/24 1200) SpO2:  [80 %-100 %] 99 % (10/24 1200) Weight:  [199 lb 4.7 oz (90.4 kg)-207 lb 0.2 oz (93.9 kg)] 199 lb 4.7 oz (90.4 kg)  (10/23 1630)  CBC:   Recent Labs Lab 10/22/16 1503 10/22/16 1507  WBC 5.8  --   NEUTROABS 3.3  --   HGB 15.4 16.3  HCT 44.7 48.0  MCV 91.8  --   PLT 175  --     Basic Metabolic Panel:   Recent Labs Lab 10/22/16 1503 10/22/16 1507  NA 141 141  K 4.1 4.1  CL 109 109  CO2 23  --   GLUCOSE 93 86  BUN 14 17  CREATININE 1.21 1.20  CALCIUM 8.9  --     Lipid Panel:     Component Value Date/Time   CHOL 198 10/23/2016 0219   TRIG 117 10/23/2016 0219   HDL 48 10/23/2016 0219   CHOLHDL 4.1 10/23/2016 0219   VLDL 23 10/23/2016 0219   LDLCALC 127 (H) 10/23/2016 0219   HgbA1c: No results found for: HGBA1C Urine Drug Screen:     Component Value Date/Time   LABOPIA NONE DETECTED 10/22/2016 1742   COCAINSCRNUR NONE DETECTED 10/22/2016 1742   LABBENZ NONE DETECTED 10/22/2016 1742   AMPHETMU NONE DETECTED 10/22/2016 1742   THCU NONE DETECTED 10/22/2016 1742   LABBARB NONE DETECTED 10/22/2016 1742      IMAGING I have personally reviewed the radiological images below and agree with the radiology interpretations.  Ct Head Code Stroke Wo Contrast 10/22/2016 1. No acute finding.  ASPECTS is 10. 2. Moderate white matter disease, likely chronic small vessel ischemia.   Ct Head Wo Contrast 10/23/2016 No intracranial hemorrhage or CT evidence of large acute infarct. Mild to moderate chronic microvascular changes. Partially empty expanded sella.   CTA NECK  10/22/2016 1. Mild atheromatous plaque about the carotid bifurcations bilaterally without flow limiting stenosis. No high-grade or correctable stenosis identified within either carotid artery system. 2. Patent vertebral arteries within the neck. 3. Enlarged mediastinal adenopathy as above, indeterminate. 4. Emphysema. 5. **An incidental finding of potential clinical significance has been found. 7 mm right upper lobe pulmonary nodule. Non-contrast chest CT at 6-12 months is recommended. If the nodule is stable at time of repeat  CT, then future CT at 18-24 months (from today's scan) is considered optional for low-risk patients, but is recommended for high-risk patients.   CTA HEAD  10/22/2016 1. Negative CTA for emergent large vessel occlusion. No correctable stenosis identified. 2. Probable short-segment severe distal left M2 stenosis as above. No other high-grade or critical stenosis within the intracranial circulation. 3. Mild atheromatous plaque within the carotid siphons bilaterally with mild stenosis. 4. Focal plaque within the left V4 segment with short-segment mild stenosis.  CT PERFUSION  10/22/2016 Nondiagnostic CT perfusion due to technical error   Dg Chest Port 1 View 10/23/2016 Mild cardiomegaly with central vascular congestion; hazy right greater than left pulmonary opacities, favor edema with infection not excluded.   EEG - This awake and drowsy EEG is normal.    24 h tPA CT head pending   PHYSICAL EXAM  Temp:  [97.3 F (36.3 C)-99.6 F (37.6 C)] 97.3 F (36.3 C) (10/24 1200) Pulse Rate:  [44-140] 92 (10/24 1200) Resp:  [15-28] 18 (10/24 1200) BP: (117-180)/(81-147) 123/81 (10/24 1200) SpO2:  [80 %-100 %] 99 % (10/24 1200) Weight:  [199 lb 4.7 oz (90.4 kg)-207 lb 0.2 oz (93.9 kg)] 199 lb 4.7 oz (90.4 kg) (10/23 1630)  General - Well nourished, well developed, in no apparent distress.  Ophthalmologic - Fundi not visualized due to non cooperation.  Cardiovascular - Regular rate and rhythm.  Mental Status -  Level of arousal and orientation to time, place, and person were intact. Language exam showed able to follow simple commands, but hesitancy of speech, mild expressive aphasia, mild to moderate dysarthria, intact repetition and naming 5/6.  Cranial Nerves II - XII - II - Visual field intact OU. III, IV, VI - Extraocular movements intact. V - Facial sensation intact bilaterally. VII - mild right facial droop. VIII - Hard of hearing & vestibular intact bilaterally. X - Palate  elevates symmetrically. XI - Chin turning & shoulder shrug intact bilaterally. XII - Tongue protrusion intact.  Motor Strength - The patient's strength was normal in all extremities and pronator drift was absent.  Bulk was normal and fasciculations were absent.   Motor Tone - Muscle tone was assessed at the neck and appendages and was normal.  Reflexes - The patient's reflexes were 1+ in all extremities and he had no pathological reflexes.  Sensory - Light touch, temperature/pinprick were assessed and were symmetrical.    Coordination - The patient had normal movements in the hands with no ataxia or dysmetria.  Tremor was absent.  Gait and Station - deferred.   ASSESSMENT/PLAN [name] is a 65 y.o. male with history of HTN and smoking presenting with global aphasia and R drooling. He received IV t-PA 10/22/2016 at 1526.   Stroke:  left MCA infarct s/p  IV tPA, infarct embolic secondary to unknown source.   Initial CT - unrevealing   CT at time of neurological worsening - unrevealing  CTA head no emergent large vessel occlusion. Possible severe L M2 distal stenosis.  CTA neck no high-grade stenosis. Enlarged mediastinal adenopathy. 7 mm RUL pulmonary nodule.  MRI  Pt refused  MRA  Pt refused  2D Echo  pending   EEG normal, no seizure like activity  LDL 127  HgbA1c pending  SCDs for VTE prophylaxis Diet NPO time specified  No antithrombotic prior to admission, now on No antithrombotic as within 24 hours post TPA. Anticipate addition of aspirin at 24 hours if imaging negative for hemorrhage.  Ongoing aggressive stroke risk factor management  Therapy recommendations:  CIR, HH ST. rehabilitation consult has been placed.  Disposition:  pending   Episodes of worsening of speech or left sided weakness post tPA  Etiology not clear - seems involving both hemisphere  Could be BP related as both episode SBP at 120s, but still good at DBP  Resolved after CT head    EEG normal  On IVF and albumin  Close monitoring  Hypertensive emergency  BP as high as 180/147  Improved this am  BP goal 185/120 per neuro attending after neuro worsening  Long-term BP goal normotensive  Hyperlipidemia  Home meds:  No statin  LDL 127, goal < 70  Add lipitor once po access  Continue statin at discharge  Tobacco abuse  Current smoker  Smoking cessation counseling provided  Pt is willing to quit  Other Stroke Risk Factors  Advanced age  ETOH use  Overweight, Body mass index is 29.43 kg/m., recommend weight loss, diet and exercise as appropriate   Pulmonary nodule, right upper lobe  Incidental finding on CT neck   Repeat CT in 6-12 months  Other Active Problems    Hospital day # 1  This patient is critically ill due to left MCA infarct s/p tPA, episodic worsening symptoms and at significant risk of neurological worsening, death form hemorrhage, hemorrhagic transformation. This patient's care requires constant monitoring of vital signs, hemodynamics, respiratory and cardiac monitoring, review of multiple databases, neurological assessment, discussion with family, other specialists and medical decision making of high complexity. I spent 40 minutes of neurocritical care time in the care of this patient.  Marvel Plan, MD PhD Stroke Neurology 10/23/2016 2:01 PM    To contact Stroke Continuity provider, please refer to [email or web address]. After hours, contact General Neurology

## 2016-10-23 NOTE — Evaluation (Signed)
Clinical/Bedside Swallow Evaluation Patient Details  Name: Ronald Terry MRN: [id number] Date of Birth: May 20, 1951  Today's Date: 10/23/2016 Time: SLP Start Time (ACUTE ONLY): [id number] SLP Stop Time (ACUTE ONLY): 0832 SLP Time Calculation (min) (ACUTE ONLY): 10 min  Past Medical History:  Past Medical History:  Diagnosis Date  . HTN (hypertension)   . Smoking    Past Surgical History: History reviewed. No pertinent surgical history. HPI:  Ronald Terry an 65 y.o.admitted after difficulty with gate and unable to speak.. CT head showed no acute stroke or bleed. MRI pending. CXR mild cardiomegaly with central vascular congestion; hazy right greater than left pulmonary opacities, favor edema with infection   Assessment / Plan / Recommendation Clinical Impression  Immediate and delayed throat clears intermittently following thin with consecutive straw and cup sips mitigated with single small sips demonstrated. Mastication and transit of solid cracker was within functional limits. Recommend regular texture and thin liquids, no straws and pills whole in applesauce. ST will follow up.      Aspiration Risk  Mild aspiration risk    Diet Recommendation Regular;Thin liquid   Liquid Administration via: No straw;Cup Medication Administration: Whole meds with puree Supervision: Patient able to self feed;Intermittent supervision to cue for compensatory strategies Compensations: Slow rate;Small sips/bites Postural Changes: Seated upright at 90 degrees    Other  Recommendations Oral Care Recommendations: Oral care BID   Follow up Recommendations None      Frequency and Duration min 2x/week  2 weeks       Prognosis Prognosis for Safe Diet Advancement: Good      Swallow Study   General HPI: Ronald Terry an 65 y.o.admitted after difficulty with gate and unable to speak.. CT head showed no acute stroke or bleed. MRI pending. CXR mild cardiomegaly with central vascular congestion; hazy  right greater than left pulmonary opacities, favor edema with infection Type of Study: Bedside Swallow Evaluation Previous Swallow Assessment:  (none) Diet Prior to this Study: NPO Temperature Spikes Noted: No Respiratory Status: Nasal cannula History of Recent Intubation: No Behavior/Cognition: Alert;Cooperative;Pleasant mood Oral Cavity Assessment: Within Functional Limits Oral Care Completed by SLP: No Oral Cavity - Dentition:  (missing most upper and some lower) Vision: Functional for self-feeding Self-Feeding Abilities: Able to feed self;Needs set up Patient Positioning: Upright in bed Baseline Vocal Quality: Normal Volitional Cough: Strong Volitional Swallow: Able to elicit    Oral/Motor/Sensory Function Overall Oral Motor/Sensory Function: Within functional limits   Ice Chips Ice chips: Not tested   Thin Liquid Thin Liquid: Impaired Presentation: Cup;Straw Pharyngeal  Phase Impairments: Throat Clearing - Delayed;Throat Clearing - Immediate    Nectar Thick Nectar Thick Liquid: Not tested   Honey Thick Honey Thick Liquid: Not tested   Puree Puree: Within functional limits   Solid   GO   Solid: Within functional limits        Roque CashLitaker, Breck CoonsLisa Willis 10/23/2016,8:55 AM Breck CoonsLisa Willis Lonell FaceLitaker M.Ed [employer] Pager [phone]

## 2016-10-23 NOTE — Plan of Care (Signed)
Problem: Nutrition: Goal: Risk of aspiration will decrease Outcome: Not Progressing See progress notes for events in a.m. Pt NPO per MD

## 2016-10-23 NOTE — Progress Notes (Signed)
EEG Completed; Results Pending  

## 2016-10-23 NOTE — Progress Notes (Signed)
Paged MD regarding some dyspnea on exertion, wet gurgling breath sounds, received orders for NPO, speech eval, chest x ray.

## 2016-10-23 NOTE — Care Management Note (Signed)
Case Management Note  Patient Details  Name: Ronald Terry MRN: [id number] Date of Birth: 08-01-1951  Subjective/Objective: Pt admitted on 10/22/16 with Code Stroke s/p TPA.  PTA, pt independent, lives with brother.                      Action/Plan: PT/OT recommending CIR consult, and consult currently in progress.  Will follow for discharge planning as pt progresses.     Expected Discharge Date:   Expected Discharge Plan:  IP Rehab Facility  In-House Referral:     Discharge planning Services  CM Consult  Post Acute Care Choice:    Choice offered to:     DME Arranged:    DME Agency:     HH Arranged:    HH Agency:     Status of Service:  In process, will continue to follow  If discussed at Long Length of Stay Meetings, dates discussed:    Additional Comments:  Quintella BatonJulie W. Eh Sesay, RN, BSN  Trauma/Neuro ICU Case Manager [phone]

## 2016-10-23 NOTE — Evaluation (Signed)
Speech Language Pathology Evaluation Patient Details Name: Ronald Terry MRN: [id number] DOB: 12-Oct-1951 Today's Date: 10/23/2016 Time: 0822-0832 SLP Time Calculation (min) (ACUTE ONLY): 10 min  Problem List:  Patient Active Problem List   Diagnosis Date Noted  . Stroke (cerebrum) (HCC) 10/22/2016   Past Medical History:  Past Medical History:  Diagnosis Date  . HTN (hypertension)   . Smoking    Past Surgical History: History reviewed. No pertinent surgical history. HPI:  Ronald Terry an 65 y.o.admitted after difficulty with gate and unable to speak.. CT head showed no acute stroke or bleed. MRI pending. CXR mild cardiomegaly with central vascular congestion; hazy right greater than left pulmonary opacities, favor edema with infection   Assessment / Plan / Recommendation Clinical Impression  Pt's aphasia from initial presentation to hospital appears to have resolved. During assessment he exhibited dysfluency mostly on initial sounds. Mild-moderate dysarthria marked by imprecise articulation and increased rate of speech. He recalled 1/4 words independently and 2/4 incorrect given choices. Pt would benefit from continued ST for communicative-cognitive independence.     SLP Assessment  Patient needs continued Speech Lanaguage Pathology Services    Follow Up Recommendations  Home health SLP    Frequency and Duration min 2x/week  2 weeks      SLP Evaluation Cognition  Overall Cognitive Status: Impaired/Different from baseline Arousal/Alertness: Awake/alert Orientation Level: Oriented to place;Oriented to person;Oriented to situation Attention: Sustained Sustained Attention: Appears intact Memory: Impaired Memory Impairment: Storage deficit;Retrieval deficit;Decreased recall of new information (1/2 independent, 1/4 semantic cue, 2/4 incorrect w/ choice) Awareness: Appears intact Problem Solving: Appears intact Safety/Judgment: Impaired (questionable)        Comprehension  Auditory Comprehension Overall Auditory Comprehension: Appears within functional limits for tasks assessed Yes/No Questions: Not tested Commands: Within Functional Limits Visual Recognition/Discrimination Discrimination: Not tested Reading Comprehension Reading Status:  (TBA)    Expression Expression Primary Mode of Expression: Verbal Verbal Expression Overall Verbal Expression: Appears within functional limits for tasks assessed Initiation: No impairment Level of Generative/Spontaneous Verbalization: Conversation Repetition: No impairment (mild error if longer sentence) Naming: No impairment Pragmatics: No impairment Written Expression Dominant Hand: Right Written Expression:  (TBA)   Oral / Motor  Oral Motor/Sensory Function Overall Oral Motor/Sensory Function: Within functional limits Motor Speech Overall Motor Speech: Impaired Respiration: Within functional limits Phonation: Normal Resonance: Within functional limits Articulation: Impaired Level of Impairment: Sentence Intelligibility: Intelligibility reduced Word: 75-100% accurate Phrase: 75-100% accurate Sentence: 75-100% accurate Conversation: 50-74% accurate Motor Planning: Witnin functional limits   GO                    Royce MacadamiaLitaker, Nikoletta Varma Willis 10/23/2016, 9:12 AM  Breck CoonsLisa Willis Konrad Hoak M.Ed [employer] Pager [phone]

## 2016-10-23 NOTE — Evaluation (Signed)
Occupational Therapy Evaluation Patient Details Name: Chevon Laufer MRN: [id number] DOB: 05-Apr-1951 Today's Date: 10/23/2016    History of Present Illness Katrell Milhorn is an 65 y.o. admitted after difficulty with gate and unable to speak.. CT head showed no acute stroke or bleed. MRI pending. CXR mild cardiomegaly with central vascular congestion; hazy right greater than left pulmonary opacities, favor edema with infection . Pending stat MRI        Clinical Impression  Past Medical History:  Diagnosis Date  . HTN (hypertension)   . Smoking     PT admitted with pending CVA workup underway. Pt with decr arousal during evaluation questioning and RN called immediately to room. Pt s/p TPA.Marland Kitchen Pt currently with functional limitiations due to the deficits listed below (see OT problem list). PTA was independent with all adls and drives. Pt reports driving with friend when symptoms started and friend asked patient to pull over due to changes.  Pt will benefit from skilled OT to increase their independence and safety with adls and balance to allow discharge CIR.     Follow Up Recommendations  CIR    Equipment Recommendations  Other (comment) (TBA)    Recommendations for Other Services Rehab consult     Precautions / Restrictions Precautions Precautions: Fall      Mobility Bed Mobility Overal bed mobility: Needs Assistance Bed Mobility: Rolling Rolling: +2 for physical assistance;Max assist         General bed mobility comments: Pt log R and L to complete peri care. Pt no initation at this time. flaccid L UE   Transfers                 General transfer comment: n/a this time    Balance                                            ADL Overall ADL's : Needs assistance/impaired Eating/Feeding: Set up;Bed level Eating/Feeding Details (indicate cue type and reason): completed full meal on arrival                                    General ADL Comments: Pt supine alert on arrival and greeted therapist. Pt reports breakfast was good and he ate it all. pt noted to be incontinent of bladder and bowel. Pt with decr arousal in the middle of discussion. Speech more slurred in nature. Pt log rolled R and L for hygiene . RN called to room due to decr arousal and L buttock with wound present     Vision Additional Comments: pt was tracking therapist in room initially . pt with eyes closed due to decr arousal   Perception     Praxis      Pertinent Vitals/Pain Pain Assessment: No/denies pain     Hand Dominance Right   Extremity/Trunk Assessment Upper Extremity Assessment Upper Extremity Assessment: LUE deficits/detail LUE Deficits / Details: flaccid with no response , unable to hold against gravity. reaching with R UE for L UE  LUE Sensation:  (reports same when asked )   Lower Extremity Assessment Lower Extremity Assessment: LLE deficits/detail LLE Deficits / Details: flaccid initially during session. pt does demonstrate knee flexion toward end of session wth Dr Roda Shutters present evaluating patient       Communication Communication  Communication: HOH;Expressive difficulties   Cognition Arousal/Alertness: Lethargic Behavior During Therapy: Flat affect Overall Cognitive Status: Impaired/Different from baseline                     General Comments       Exercises       Shoulder Instructions      Home Living Family/patient expects to be discharged to:: Private residence Living Arrangements: Other relatives (lives with brother) Available Help at Discharge: Family;Available 24 hours/day Type of Home: House Home Access: Stairs to enter Entergy CorporationEntrance Stairs-Number of Steps: 5   Home Layout: One level     Bathroom Shower/Tub: Chief Strategy OfficerTub/shower unit   Bathroom Toilet: Standard     Home Equipment: None   Additional Comments: drives   Lives With: Family (brother)    Prior Functioning/Environment Level of  Independence: Independent                 OT Problem List: Decreased strength;Decreased activity tolerance;Impaired balance (sitting and/or standing);Decreased safety awareness;Decreased knowledge of use of DME or AE;Decreased knowledge of precautions;Cardiopulmonary status limiting activity;Obesity;Impaired UE functional use;Impaired sensation;Decreased cognition;Decreased coordination;Decreased range of motion   OT Treatment/Interventions: Self-care/ADL training;Therapeutic exercise;Neuromuscular education;DME and/or AE instruction;Therapeutic activities;Visual/perceptual remediation/compensation;Patient/family education;Cognitive remediation/compensation;Balance training    OT Goals(Current goals can be found in the care plan section) Acute Rehab OT Goals Patient Stated Goal: none stated OT Goal Formulation: With family Time For Goal Achievement: 11/06/16 Potential to Achieve Goals: Good  OT Frequency: Min 2X/week   Barriers to D/C:            Co-evaluation              End of Session Equipment Utilized During Treatment: Oxygen Nurse Communication: Precautions;Mobility status  Activity Tolerance: Patient limited by fatigue Patient left: in bed;with call bell/phone within reach;with nursing/sitter in room;Other (comment) (Dr Roda ShuttersXu at bedside)   Time: [id number] OT Time Calculation (min): 25 min Charges:  OT General Charges $OT Visit: 1 Procedure OT Evaluation $OT Eval Moderate Complexity: 1 Procedure G-Codes:    Harolyn RutherfordJones, Raymundo Rout B 10/23/2016, 10:22 AM   Mateo FlowJones, Brynn   OTR/L Pager: [phone] Office: [phone] .

## 2016-10-23 NOTE — Progress Notes (Signed)
Patient completed head CT. Upon coming out of CT scan patient was assessed by MD. Speech clear. Patient MOE x4. Disoriented as to the events that jsut occurred but able to answer questions appropriately and understand. Patient transported back to Neuro ICU. VS and assessment per flowsheet. Continuing to monitor closely.

## 2016-10-23 NOTE — Progress Notes (Signed)
Dr. Roda ShuttersXu made aware patient refusing MRI. States he is "claustrophobic" and telling the MRI techs he does not want any medication to help him relax to get the scan done. MRI cancelled per MD. Will send patient for repeat CT of head at 1730 per MD. Continuing to monitor

## 2016-10-23 NOTE — Progress Notes (Signed)
Patient off unit to MRI. Continuing to monitor.

## 2016-10-23 NOTE — Progress Notes (Signed)
Dr. Roda ShuttersXu updated on patient condition: made aware PT was getting ready to work with patient when patient was assessed as having decreased LOC, incontinence of urine and stool, arousable with garbled speech, and flaccid on left side. VS per flowsheet. Continuing to monitor closely.

## 2016-10-24 ENCOUNTER — Other Ambulatory Visit (HOSPITAL_COMMUNITY): Payer: Medicare Other

## 2016-10-24 ENCOUNTER — Inpatient Hospital Stay (HOSPITAL_COMMUNITY): Payer: Medicare Other

## 2016-10-24 DIAGNOSIS — E7800 Pure hypercholesterolemia, unspecified: Secondary | ICD-10-CM

## 2016-10-24 LAB — HEMOGLOBIN A1C
HEMOGLOBIN A1C: 5.4 % (ref 4.8–5.6)
MEAN PLASMA GLUCOSE: 108 mg/dL

## 2016-10-24 LAB — BASIC METABOLIC PANEL
Anion gap: 7 (ref 5–15)
BUN: 14 mg/dL (ref 6–20)
CO2: 25 mmol/L (ref 22–32)
CREATININE: 0.96 mg/dL (ref 0.61–1.24)
Calcium: 8.7 mg/dL — ABNORMAL LOW (ref 8.9–10.3)
Chloride: 109 mmol/L (ref 101–111)
Glucose, Bld: 106 mg/dL — ABNORMAL HIGH (ref 65–99)
POTASSIUM: 4 mmol/L (ref 3.5–5.1)
SODIUM: 141 mmol/L (ref 135–145)

## 2016-10-24 LAB — CBC
HCT: 43.5 % (ref 39.0–52.0)
Hemoglobin: 14.5 g/dL (ref 13.0–17.0)
MCH: 30.7 pg (ref 26.0–34.0)
MCHC: 33.3 g/dL (ref 30.0–36.0)
MCV: 92.2 fL (ref 78.0–100.0)
PLATELETS: 159 10*3/uL (ref 150–400)
RBC: 4.72 MIL/uL (ref 4.22–5.81)
RDW: 14 % (ref 11.5–15.5)
WBC: 7.1 10*3/uL (ref 4.0–10.5)

## 2016-10-24 MED ORDER — LABETALOL HCL 5 MG/ML IV SOLN: 10.0000 mg | INTRAVENOUS | Status: DC | PRN

## 2016-10-24 MED ORDER — ALBUTEROL SULFATE (2.5 MG/3ML) 0.083% IN NEBU: 3.0000 mL | INHALATION_SOLUTION | Freq: Four times a day (QID) | RESPIRATORY_TRACT | Status: DC | PRN

## 2016-10-24 MED ORDER — ATORVASTATIN CALCIUM 10 MG PO TABS
20.0000 mg | ORAL_TABLET | Freq: Every day | ORAL | Status: DC
  Administered 2016-10-24: 20 mg via ORAL
  Filled 2016-10-24: qty 2

## 2016-10-24 MED ORDER — ASPIRIN EC 325 MG PO TBEC
325.0000 mg | DELAYED_RELEASE_TABLET | Freq: Every day | ORAL | Status: DC
  Administered 2016-10-24 – 2016-10-25 (×2): 325 mg via ORAL
  Filled 2016-10-24 (×2): qty 1

## 2016-10-24 MED ORDER — ENOXAPARIN SODIUM 40 MG/0.4ML ~~LOC~~ SOLN
40.0000 mg | SUBCUTANEOUS | Status: DC
  Administered 2016-10-24 – 2016-10-25 (×2): 40 mg via SUBCUTANEOUS
  Filled 2016-10-24 (×2): qty 0.4

## 2016-10-24 MED ORDER — PANTOPRAZOLE SODIUM 40 MG PO TBEC
40.0000 mg | DELAYED_RELEASE_TABLET | Freq: Every day | ORAL | Status: DC
  Administered 2016-10-24 – 2016-10-25 (×2): 40 mg via ORAL
  Filled 2016-10-24 (×2): qty 1

## 2016-10-24 MED ORDER — ASPIRIN 300 MG RE SUPP: 300.0000 mg | Freq: Every day | RECTAL | Status: DC

## 2016-10-24 MED ORDER — ALBUTEROL SULFATE (2.5 MG/3ML) 0.083% IN NEBU
3.0000 mL | INHALATION_SOLUTION | Freq: Four times a day (QID) | RESPIRATORY_TRACT | Status: DC | PRN
  Administered 2016-10-24: 3 mL via RESPIRATORY_TRACT
  Filled 2016-10-24: qty 3

## 2016-10-24 NOTE — Progress Notes (Signed)
Physical Therapy Treatment Patient Details Name: Ronald Terry MRN: [id number] DOB: 01/02/1951 Today's Date: 10/24/2016    History of Present Illness Ronald PiesWillard Terry is an 65 y.o. admitted after difficulty with gate and unable to speak.. CT head showed no acute stroke or bleed. MRI pending. CXR mild cardiomegaly with central vascular congestion; hazy right greater than left pulmonary opacities, favor edema with infection . CT 10/23/16 negative    PT Comments    Patient seen for mobility progression, patient with significant improvements with mobility this day. Tolerated activity well, VSS throughout session with orthostatics assessed and stable. Patient ambulating with supervision a this time, will update discharge recommendation to home with HHPT and initial supervision.  Follow Up Recommendations  Home health PT;Supervision/Assistance - 24 hour     Equipment Recommendations   (TBD)    Recommendations for Other Services       Precautions / Restrictions Precautions Precautions: Fall    Mobility  Bed Mobility Overal bed mobility: Modified Independent             General bed mobility comments: bed flat and able to exit L side  Transfers Overall transfer level: Modified independent               General transfer comment: push of bil UE on bed surface  Ambulation/Gait Ambulation/Gait assistance: Supervision Ambulation Distance (Feet): 180 Feet Assistive device: None Gait Pattern/deviations: Step-through pattern;Decreased stride length;Drifts right/left Gait velocity: decreased Gait velocity interpretation: Below normal speed for age/gender General Gait Details: some modest instability but no physical assist required   Stairs            Wheelchair Mobility    Modified Rankin (Stroke Patients Only) Modified Rankin (Stroke Patients Only) Pre-Morbid Rankin Score: No symptoms Modified Rankin: Moderately severe disability     Balance                                     Cognition Arousal/Alertness: Awake/alert Behavior During Therapy: WFL for tasks assessed/performed Overall Cognitive Status: Difficult to assess                      Exercises      General Comments        Pertinent Vitals/Pain Pain Assessment: No/denies pain    Home Living                      Prior Function            PT Goals (current goals can now be found in the care plan section) Acute Rehab PT Goals Patient Stated Goal: none stated PT Goal Formulation: With patient Time For Goal Achievement: 11/06/16 Potential to Achieve Goals: Good Progress towards PT goals: Progressing toward goals    Frequency    Min 3X/week      PT Plan Discharge plan needs to be updated    Co-evaluation             End of Session Equipment Utilized During Treatment: Gait belt Activity Tolerance: Patient tolerated treatment well Patient left:  (in wheel chair for transport)     Time: [id number] PT Time Calculation (min) (ACUTE ONLY): 11 min  Charges:  $Gait Training: 8-22 mins                    G CodesFabio Asa:      Peggy Monk J 10/24/2016,  1:14 PM Charlotte Crumb, PT DPT [phone]

## 2016-10-24 NOTE — Progress Notes (Signed)
Occupational Therapy Treatment Patient Details Name: Ronald Terry MRN: [id number] DOB: [birth year] Today's Date: 10/24/2016    History of present illness Lisle Skillman is an 65 y.o. admitted after difficulty with gate and unable to speak.. CT head showed no acute stroke or bleed. MRI pending. CXR mild cardiomegaly with central vascular congestion; hazy right greater than left pulmonary opacities, favor edema with infection . CT 10/23/16 negative   OT comments  Pt progressed well this session and met 3 goals. Discharge recommendations updated to home based on this session. Next session to focus on cognitive deficits.     Follow Up Recommendations  Home health OT    Equipment Recommendations  None recommended by OT    Recommendations for Other Services      Precautions / Restrictions Precautions Precautions: Fall       Mobility Bed Mobility Overal bed mobility: Modified Independent             General bed mobility comments: bed flat and able to exit L side  Transfers Overall transfer level: Modified independent               General transfer comment: push of bil UE on bed surface    Balance                                   ADL Overall ADL's : Needs assistance/impaired     Grooming: Wash/dry face;Wash/dry hands;Supervision/safety;Standing Grooming Details (indicate cue type and reason): pt washed face and completed peri care     Lower Body Bathing: Supervison/ safety;Sit to/from stand Lower Body Bathing Details (indicate cue type and reason): pt incontinent of bladder and lack of awareness. pt provided peri care at sink level  Upper Body Dressing : Minimal assistance Upper Body Dressing Details (indicate cue type and reason): (A) due to lines / leads. pt able to sequence task without (A)     Toilet Transfer: Supervision/safety           Functional mobility during ADLs: Supervision/safety (OBSERVED WITH PT) General ADL Comments: Pt  much more alert and responsive this session compared to previous evaluation.       Vision                     Perception     Praxis      Cognition   Behavior During Therapy: WFL for tasks assessed/performed Overall Cognitive Status: Difficult to assess                       Extremity/Trunk Assessment               Exercises     Shoulder Instructions       General Comments      Pertinent Vitals/ Pain       Pain Assessment: No/denies pain  Home Living                                          Prior Functioning/Environment              Frequency  Min 2X/week        Progress Toward Goals  OT Goals(current goals can now be found in the care plan section)  Progress towards OT goals: Progressing toward goals  Acute Rehab  OT Goals Patient Stated Goal: none stated OT Goal Formulation: With family Time For Goal Achievement: 11/06/16 Potential to Achieve Goals: Good ADL Goals Pt Will Perform Grooming: with supervision;sitting (MEt) Pt Will Perform Upper Body Bathing: with supervision;sitting (MEt) Pt Will Perform Lower Body Bathing: sit to/from stand;with min assist Pt Will Transfer to Toilet: with min assist;bedside commode;stand pivot transfer (MEt) Additional ADL Goal #1: Pt will visually attend to L side during ADL task  Plan Discharge plan needs to be updated    Co-evaluation                 End of Session Equipment Utilized During Treatment: Oxygen   Activity Tolerance Patient tolerated treatment well   Patient Left Other (comment) (with PT to ambulate)   Nurse Communication Mobility status;Precautions        Time: 2417-5301 OT Time Calculation (min): 8 min  Charges: OT General Charges $OT Visit: 1 Procedure OT Treatments $Self Care/Home Management : 8-22 mins  Parke Poisson B 10/24/2016, 10:55 AM    Jeri Modena   OTR/L Pager: [phone] Office: [phone] .

## 2016-10-24 NOTE — Progress Notes (Signed)
Patient transferred to 5M14. Patient is alert, oriented x4, NIHHS 2 for speech, moving all extremities equally, skin intact, tele set up on box #15, tray ordered for patient's lunch. Will continue to monitor.

## 2016-10-24 NOTE — Progress Notes (Signed)
STROKE TEAM PROGRESS NOTE   SUBJECTIVE (INTERVAL HISTORY) No family is at bedside. Overnight, he has no acute issues. No recurrent episodes. This morning pt still refuse MRI. Repeat CT head no bleeding and pt will be transferred to floor.   OBJECTIVE Temp:  [97.4 F (36.3 C)-98.1 F (36.7 C)] 98.1 F (36.7 C) (10/25 0800) Pulse Rate:  [86-102] 95 (10/25 1000) Cardiac Rhythm: Normal sinus rhythm (10/25 1115) Resp:  [13-27] 14 (10/25 0900) BP: (115-147)/(86-115) 134/94 (10/25 1000) SpO2:  [92 %-100 %] 92 % (10/25 1000)  CBC:   Recent Labs Lab 10/22/16 1503 10/22/16 1507 10/24/16 0303  WBC 5.8  --  7.1  NEUTROABS 3.3  --   --   HGB 15.4 16.3 14.5  HCT 44.7 48.0 43.5  MCV 91.8  --  92.2  PLT 175  --  159    Basic Metabolic Panel:   Recent Labs Lab 10/22/16 1503 10/22/16 1507 10/24/16 0303  NA 141 141 141  K 4.1 4.1 4.0  CL 109 109 109  CO2 23  --  25  GLUCOSE 93 86 106*  BUN 14 17 14   CREATININE 1.21 1.20 0.96  CALCIUM 8.9  --  8.7*    Lipid Panel:     Component Value Date/Time   CHOL 198 10/23/2016 0219   TRIG 117 10/23/2016 0219   HDL 48 10/23/2016 0219   CHOLHDL 4.1 10/23/2016 0219   VLDL 23 10/23/2016 0219   LDLCALC 127 (H) 10/23/2016 0219   HgbA1c:  Lab Results  Component Value Date   HGBA1C 5.4 10/23/2016   Urine Drug Screen:     Component Value Date/Time   LABOPIA NONE DETECTED 10/22/2016 1742   COCAINSCRNUR NONE DETECTED 10/22/2016 1742   LABBENZ NONE DETECTED 10/22/2016 1742   AMPHETMU NONE DETECTED 10/22/2016 1742   THCU NONE DETECTED 10/22/2016 1742   LABBARB NONE DETECTED 10/22/2016 1742      IMAGING I have personally reviewed the radiological images below and agree with the radiology interpretations.  Ct Head Code Stroke Wo Contrast 10/22/2016 1. No acute finding. ASPECTS is 10. 2. Moderate white matter disease, likely chronic small vessel ischemia.   Ct Head Wo Contrast 10/23/2016 No intracranial hemorrhage or CT evidence  of large acute infarct. Mild to moderate chronic microvascular changes. Partially empty expanded sella.   CTA NECK  10/22/2016 1. Mild atheromatous plaque about the carotid bifurcations bilaterally without flow limiting stenosis. No high-grade or correctable stenosis identified within either carotid artery system. 2. Patent vertebral arteries within the neck. 3. Enlarged mediastinal adenopathy as above, indeterminate. 4. Emphysema. 5. **An incidental finding of potential clinical significance has been found. 7 mm right upper lobe pulmonary nodule. Non-contrast chest CT at 6-12 months is recommended. If the nodule is stable at time of repeat CT, then future CT at 18-24 months (from today's scan) is considered optional for low-risk patients, but is recommended for high-risk patients.   CTA HEAD  10/22/2016 1. Negative CTA for emergent large vessel occlusion. No correctable stenosis identified. 2. Probable short-segment severe distal left M2 stenosis as above. No other high-grade or critical stenosis within the intracranial circulation. 3. Mild atheromatous plaque within the carotid siphons bilaterally with mild stenosis. 4. Focal plaque within the left V4 segment with short-segment mild stenosis.  CT PERFUSION  10/22/2016 Nondiagnostic CT perfusion due to technical error   Dg Chest Port 1 View 10/23/2016 Mild cardiomegaly with central vascular congestion; hazy right greater than left pulmonary opacities, favor edema with infection  not excluded.   EEG - This awake and drowsy EEG is normal.    CT head 10/24/15 Scattered patchy white matter changes have been attributed to chronic microvascular disease and appear similar to prior exam. This limits detection of small acute infarct. If further delineation is clinically desired and patient is able, MR imaging may be considered. No CT evidence of large acute infarct. No intracranial hemorrhage.   PHYSICAL EXAM  Temp:  [97.4 F (36.3 C)-98.1 F  (36.7 C)] 98.1 F (36.7 C) (10/25 0800) Pulse Rate:  [86-102] 95 (10/25 1000) Resp:  [13-27] 14 (10/25 0900) BP: (115-147)/(86-115) 134/94 (10/25 1000) SpO2:  [92 %-100 %] 92 % (10/25 1000)  General - Well nourished, well developed, in no apparent distress.  Ophthalmologic - Fundi not visualized due to non cooperation.  Cardiovascular - Regular rate and rhythm.  Mental Status -  Level of arousal and orientation to time, place, and person were intact. Language exam showed able to follow simple commands, but mild hesitancy of speech, mild to moderate dysarthria, intact repetition and naming 5/6.  Cranial Nerves II - XII - II - Visual field intact OU. III, IV, VI - Extraocular movements intact. V - Facial sensation intact bilaterally. VII - mild right facial droop. VIII - Hard of hearing & vestibular intact bilaterally. X - Palate elevates symmetrically. XI - Chin turning & shoulder shrug intact bilaterally. XII - Tongue protrusion intact.  Motor Strength - The patient's strength was normal in all extremities and pronator drift was absent.  Bulk was normal and fasciculations were absent.   Motor Tone - Muscle tone was assessed at the neck and appendages and was normal.  Reflexes - The patient's reflexes were 1+ in all extremities and he had no pathological reflexes.  Sensory - Light touch, temperature/pinprick were assessed and were symmetrical.    Coordination - The patient had normal movements in the hands with no ataxia or dysmetria.  Tremor was absent.  Gait and Station - deferred.   ASSESSMENT/PLAN [name] is a 65 y.o. male with history of HTN and smoking presenting with global aphasia and R drooling. He received IV t-PA 10/22/2016 at 1526.   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 Episode of left sided weakness concerning for right brain TIA.   Initial CT - unrevealing   CT at time of neurological worsening  - unrevealing  CTA head no emergent large vessel occlusion. Possible severe L M2 distal stenosis.  CTA neck no high-grade stenosis. Enlarged mediastinal adenopathy. 7 mm RUL pulmonary nodule.  MRI  Pt refused  MRA  Pt refused  CT repeat 24 h after tPA - unrevealing  2D Echo  pending   EEG normal, no seizure like activity  Need to consider TEE and loop for further evaluation  LDL 127  HgbA1c 5.4  lovenox subq for VTE prophylaxis.  Diet Heart Room service appropriate? Yes; Fluid consistency: Thin  No antithrombotic prior to admission, now on ASA 325mg .  Ongoing aggressive stroke risk factor management  Therapy recommendations:  HH ST PT OT.  Disposition:  pending   Episodes of worsening of speech or left sided weakness post tPA  Etiology not clear - seems involving both hemisphere  Could be BP related as both episode SBP at 120s, but still good at DBP  Resolved after CT head   EEG normal  completed IVF and albumin  Close monitoring  Hypertensive emergency  BP as high as  180/147  Improved this am Stable so far  Hyperlipidemia  Home meds:  No statin  LDL 127, goal < 70  Add lipitor 20mg   Continue statin at discharge  Tobacco abuse  Current smoker  Smoking cessation counseling provided  Pt is willing to quit  Other Stroke Risk Factors  Advanced age  ETOH use  Overweight, Body mass index is 29.43 kg/m., recommend weight loss, diet and exercise as appropriate   Pulmonary nodule, right upper lobe  Incidental finding on CT neck   Repeat CT in 6-12 months  Other Active Problems    Hospital day # 2   Marvel Plan, MD PhD Stroke Neurology 10/24/2016 2:12 PM    To contact Stroke Continuity provider, please refer to [email or web address]. After hours, contact General Neurology

## 2016-10-24 NOTE — Progress Notes (Signed)
Inpatient Rehabilitation  I reviewed the recommendations from the IP Rehab consult with the patient.  I note that OT has changed recommendation to home with home health.  I do not anticipate pt. will require an IP rehab admission but will follow at a distance for a day or two.  Please call if questions.  Weldon PickingSusan Arlo Buffone PT Inpatient Rehab Admissions Coordinator Cell [phone] Office [phone]

## 2016-10-24 NOTE — Progress Notes (Signed)
Pt attempting to get OOB. Bed alarm sound. This RN in room, [facility] Pt very dyspneic on exertion, wheezy, and SOB. Oxygen reapplied, and encourage to cough and deep breathe. Pt requesting for his inhaler. Question pt about home meds and history (3pk/day smoking). Pt placed back into bed. MD called, notified. Orders received.  Will continue to monitor closely.

## 2016-10-25 ENCOUNTER — Inpatient Hospital Stay (HOSPITAL_COMMUNITY): Payer: Medicare Other

## 2016-10-25 DIAGNOSIS — I6789 Other cerebrovascular disease: Secondary | ICD-10-CM

## 2016-10-25 LAB — ECHOCARDIOGRAM COMPLETE
Height: 69 in
Weight: 3188.73 oz

## 2016-10-25 LAB — BASIC METABOLIC PANEL
Anion gap: 7 (ref 5–15)
BUN: 15 mg/dL (ref 6–20)
CALCIUM: 8.7 mg/dL — AB (ref 8.9–10.3)
CO2: 24 mmol/L (ref 22–32)
Chloride: 108 mmol/L (ref 101–111)
Creatinine, Ser: 1.01 mg/dL (ref 0.61–1.24)
GFR calc Af Amer: >60 mL/min (ref >60)
GLUCOSE: 95 mg/dL (ref 65–99)
Potassium: 4 mmol/L (ref 3.5–5.1)
Sodium: 139 mmol/L (ref 135–145)

## 2016-10-25 LAB — CBC
HEMATOCRIT: 41 % (ref 39.0–52.0)
Hemoglobin: 13.6 g/dL (ref 13.0–17.0)
MCH: 30.4 pg (ref 26.0–34.0)
MCHC: 33.2 g/dL (ref 30.0–36.0)
MCV: 91.5 fL (ref 78.0–100.0)
PLATELETS: 160 10*3/uL (ref 150–400)
RBC: 4.48 MIL/uL (ref 4.22–5.81)
RDW: 14.3 % (ref 11.5–15.5)
WBC: 6.5 10*3/uL (ref 4.0–10.5)

## 2016-10-25 MED ORDER — ASPIRIN 325 MG PO TBEC: 325.0000 mg | DELAYED_RELEASE_TABLET | Freq: Every day | ORAL | 5 refills | Status: AC

## 2016-10-25 MED ORDER — ATORVASTATIN CALCIUM 20 MG PO TABS: 20.0000 mg | ORAL_TABLET | Freq: Every day | ORAL | 5 refills | Status: DC

## 2016-10-25 NOTE — Progress Notes (Signed)
  Echocardiogram 2D Echocardiogram has been performed.  Nolon RodBrown, Tony 10/25/2016, 1:43 PM

## 2016-10-25 NOTE — Care Management Note (Signed)
Case Management Note  Patient Details  Name: Raden Byington MRN: [id number] Date of Birth: 13-Jun-1951  Subjective/Objective:                    Action/Plan: CM met with the patient and his sister and provided them a list of [facility] agencies in the [place] area. They selected [place]. Santiago Glad with [facility] notified and accepted the referral. Pt with no PCP. CM able to get him a new patient appointment with Financial controller at [facility]. Will update the bedside RN.   Expected Discharge Date:  10/24/16               Expected Discharge Plan:  [place]  In-House Referral:     Discharge planning Services  CM Consult  Post Acute Care Choice:  Home Health Choice offered to:  Patient, Sibling  DME Arranged:    DME Agency:     HH Arranged:  PT, OT, Speech Therapy [place] Agency:  [place]  Status of Service:  Completed, signed off  If discussed at [place] of Stay Meetings, dates discussed:    Additional Comments:  LUSTER HECHLER, RN 10/25/2016, 12:38 PM

## 2016-10-25 NOTE — Progress Notes (Signed)
Patient ambulated 80 feet with RN with standby assist. Tolerated well. He is now sitting up in chair.

## 2016-10-25 NOTE — Progress Notes (Signed)
Speech Language Pathology Treatment: Dysphagia;Cognitive-Linquistic  Patient Details Name: Ronald Terry MRN: [id number] DOB: 04-10-1951 Today's Date: 10/25/2016 Time: [id number] SLP Time Calculation (min) (ACUTE ONLY): 11 min  Assessment / Plan / Recommendation Clinical Impression  Pt has made good progress towards goals. Speech intelligibility has improved. Several instances of decreased intelligibility appeared related to dialectical difference today rather than dysarthria. Mild dysfluency continues and pt was again asked if he had stuttering behavior prior to stroke and he affirmed (different from initial assessment). Mild delayed throat clear following straw sips thin mitigated by cup sips. Continue tx while in hospital but not recommending tx after.    HPI HPI: Ronald Terry an 65 y.o.admitted after difficulty with gate and unable to speak.. CT head showed no acute stroke or bleed. MRI pending. CXR mild cardiomegaly with central vascular congestion; hazy right greater than left pulmonary opacities, favor edema with infection      SLP Plan  Continue with current plan of care     Recommendations  Diet recommendations: Regular;Thin liquid Liquids provided via: Cup Medication Administration: Whole meds with puree Supervision: Patient able to self feed Compensations: Slow rate;Small sips/bites Postural Changes and/or Swallow Maneuvers: Seated upright 90 degrees                Oral Care Recommendations: Oral care BID Follow up Recommendations: None Plan: Continue with current plan of care       GO                Royce MacadamiaLitaker, Rhianne Soman Willis 10/25/2016, 1:12 PM  Breck CoonsLisa Willis Skyeler Smola M.Ed [employer] Pager [phone]

## 2016-10-25 NOTE — Progress Notes (Signed)
Pt no longer in need of an inpt rehab admission. Recommend HH. [id number]

## 2016-10-25 NOTE — Progress Notes (Signed)
RN discussed discharge instructions with patient and patient's brother, sister. Patient and family verbalized understanding of f/u appt instructions, two new medications, s/sx of stroke, heart healthy diet. IV removed, tele removed. Neuro assessment unchanged, NIHHS unchanged. Patient to be escorted to car by wheelchair w staff.

## 2016-10-25 NOTE — Discharge Summary (Signed)
Stroke Discharge Summary  Patient ID: Ronald Terry   MRN: [id number]      DOB: 26-Feb-1951  Date of Admission: 10/22/2016 Date of Discharge: 10/25/2016  Attending Physician:  Marvel Plan, MD, Stroke MD Consulting Physician(s):    None  Patient's PCP:  No primary care provider on file.  DISCHARGE DIAGNOSIS:  Active Problems:   Stroke (cerebrum) (HCC)   Benign essential HTN   Tobacco abuse   Solitary pulmonary nodule     BMI: Body mass index is 29.43 kg/m.  Past Medical History:  Diagnosis Date  . HTN (hypertension)   . Smoking    History reviewed. No pertinent surgical history.    Medication List    TAKE these medications   acetaminophen 500 MG tablet Commonly known as:  TYLENOL Take 1,500 mg by mouth every 6 (six) hours as needed for moderate pain or headache.   aspirin 325 MG EC tablet Take 1 tablet (325 mg total) by mouth daily. Start taking on:  10/26/2016   atorvastatin 20 MG tablet Commonly known as:  LIPITOR Take 1 tablet (20 mg total) by mouth daily at 6 PM.       LABORATORY STUDIES CBC    Component Value Date/Time   WBC 6.5 10/25/2016 0538   RBC 4.48 10/25/2016 0538   HGB 13.6 10/25/2016 0538   HCT 41.0 10/25/2016 0538   PLT 160 10/25/2016 0538   MCV 91.5 10/25/2016 0538   MCH 30.4 10/25/2016 0538   MCHC 33.2 10/25/2016 0538   RDW 14.3 10/25/2016 0538   LYMPHSABS 2.1 10/22/2016 1503   MONOABS 0.3 10/22/2016 1503   EOSABS 0.1 10/22/2016 1503   BASOSABS 0.0 10/22/2016 1503   CMP    Component Value Date/Time   NA 139 10/25/2016 0538   K 4.0 10/25/2016 0538   CL 108 10/25/2016 0538   CO2 24 10/25/2016 0538   GLUCOSE 95 10/25/2016 0538   BUN 15 10/25/2016 0538   CREATININE 1.01 10/25/2016 0538   CALCIUM 8.7 (L) 10/25/2016 0538   PROT 6.6 10/22/2016 1503   ALBUMIN 3.8 10/22/2016 1503   AST 25 10/22/2016 1503   ALT 30 10/22/2016 1503   ALKPHOS 56 10/22/2016 1503   BILITOT 1.7 (H) 10/22/2016 1503   GFRNONAA >60 10/25/2016 0538   GFRAA >60 10/25/2016 0538   COAGS Lab Results  Component Value Date   INR 1.07 10/22/2016   Lipid Panel    Component Value Date/Time   CHOL 198 10/23/2016 0219   TRIG 117 10/23/2016 0219   HDL 48 10/23/2016 0219   CHOLHDL 4.1 10/23/2016 0219   VLDL 23 10/23/2016 0219   LDLCALC 127 (H) 10/23/2016 0219   HgbA1C  Lab Results  Component Value Date   HGBA1C 5.4 10/23/2016   Cardiac Panel (last 3 results) No results for input(s): CKTOTAL, CKMB, TROPONINI, RELINDX in the last 72 hours. Urinalysis    Component Value Date/Time   COLORURINE YELLOW 10/22/2016 1742   APPEARANCEUR CLEAR 10/22/2016 1742   LABSPEC 1.016 10/22/2016 1742   PHURINE 5.5 10/22/2016 1742   GLUCOSEU NEGATIVE 10/22/2016 1742   HGBUR SMALL (A) 10/22/2016 1742   BILIRUBINUR NEGATIVE 10/22/2016 1742   KETONESUR 15 (A) 10/22/2016 1742   PROTEINUR 100 (A) 10/22/2016 1742   NITRITE NEGATIVE 10/22/2016 1742   LEUKOCYTESUR NEGATIVE 10/22/2016 1742   Urine Drug Screen     Component Value Date/Time   LABOPIA NONE DETECTED 10/22/2016 1742   COCAINSCRNUR NONE DETECTED 10/22/2016 1742   LABBENZ  NONE DETECTED 10/22/2016 1742   AMPHETMU NONE DETECTED 10/22/2016 1742   THCU NONE DETECTED 10/22/2016 1742   LABBARB NONE DETECTED 10/22/2016 1742    Alcohol Level    Component Value Date/Time   ETH <5 10/22/2016 1503     SIGNIFICANT DIAGNOSTIC STUDIES Ct Head Code Stroke Wo Contrast 10/22/2016 1. No acute finding. ASPECTS is 10. 2. Moderate white matter disease, likely chronic small vessel ischemia.   Ct Head Wo Contrast 10/23/2016 No intracranial hemorrhage or CT evidence of large acute infarct. Mild to moderate chronic microvascular changes. Partially empty expanded sella.   CTA NECK  10/22/2016 1. Mild atheromatous plaque about the carotid bifurcations bilaterally without flow limiting stenosis. No high-grade or correctable stenosis identified within either carotid artery system. 2. Patent vertebral  arteries within the neck. 3. Enlarged mediastinal adenopathy as above, indeterminate. 4. Emphysema. 5. **An incidental finding of potential clinical significance has been found. 7 mm right upper lobe pulmonary nodule. Non-contrast chest CT at 6-12 months is recommended. If the nodule is stable at time of repeat CT, then future CT at 18-24 months (from today's scan) is considered optional for low-risk patients, but is recommended for high-risk patients.   CTA HEAD  10/22/2016 1. Negative CTA for emergent large vessel occlusion. No correctable stenosis identified. 2. Probable short-segment severe distal left M2 stenosis as above. No other high-grade or critical stenosis within the intracranial circulation. 3. Mild atheromatous plaque within the carotid siphons bilaterally with mild stenosis. 4. Focal plaque within the left V4 segment with short-segment mild stenosis.  CT PERFUSION  10/22/2016 Nondiagnostic CT perfusion due to technical error   Dg Chest Port 1 View 10/23/2016 Mild cardiomegaly with central vascular congestion; hazy right greater than left pulmonary opacities, favor edema with infection not excluded.   EEG - This awake and drowsyEEG is normal.   CT head 10/24/15 Scattered patchy white matter changes have been attributed to chronic microvascular disease and appear similar to prior exam. This limits detection of small acute infarct. If further delineation is clinically desired and patient is able, MR imaging may be considered. No CT evidence of large acute infarct. No intracranial hemorrhage.    HISTORY OF PRESENT ILLNESS Ronald Terry is an 65 y.o. male who was last talked to in a normal state at 12:50 with his daughter. He drove to his daughters house and he was noted to not get out pf his car. She went to his car to check on him and noted he was drooling on the right and not talk.  EMS was called and he was brought to the hospital. On arrival he was noted to have right  sided weakness and have aphasia. CT head was obtained. CT head showed no acute stroke or bleed.   Date last known well: Date: 10/22/2016 Time last known well: Time: 12:50 tPA Given: Yes   HOSPITAL COURSE [name] is a 65 y.o. male with history of HTN and smoking presenting with global aphasia and R drooling. He received IV t-PA 10/22/2016 at 1526. After tPA, aphasia improved. The night of admission, pt had worsening of aphasia. Stat CT no acute changes. Pt improved after the CT. The second day, pt had one episode of acute drowsiness and left UE and LE flaccid. Had again stat CT head showed no acute changes, pt again new symptoms resolved after CT. Pt was put on ASA after 24h tPA with CT repeat showed no bleeding. He is also put on lipitor for stroke prevention. However,  he refused MRI due to claustrophobia and refused TEE and loop. He is in agreement for 30 day cardiac monitoring at home. His speech much improved and right facial droop resolved on discharge home today with home PT/OT.   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 Episode of left sided weakness concerning for right brain TIA.   Initial CT - unrevealing   CT at time of neurological worsening - unrevealing  CTA head no emergent large vessel occlusion. Possible severe L M2 distal stenosis.  CTA neck no high-grade stenosis. 7 mm RUL pulmonary nodule.  MRI  Pt refused  MRA  Pt refused  CT repeat 24 h after tPA - unrevealing  2D Echo  pending   EEG normal, no seizure like activity  Pt refused TEE and loop for further evaluation  LDL 127  HgbA1c 5.4  lovenox subq for VTE prophylaxis.   Diet Heart Room service appropriate? Yes; Fluid consistency: Thin  No antithrombotic prior to admission, now on ASA 325mg . Continue ASA on discharge.  Ongoing aggressive stroke risk factor management  Therapy recommendations:  HH ST PT OT.  Disposition: home   Episodes of  worsening of speech or left sided weakness post tPA  Etiology not clear - seems involving both hemisphere  Could be BP related as both episode SBP at 120s, but still good at DBP  Resolved both occasions after CT head   EEG normal  completed IVF and albumin  Hypertensive emergency  BP as high as 180/147  Stable so far  No BP meds needed  Hyperlipidemia  Home meds:  No statin  LDL 127, goal < 70  Add lipitor 20mg   Continue statin at discharge  Tobacco abuse  Current smoker  Smoking cessation counseling provided  Pt is willing to quit  Other Stroke Risk Factors  Advanced age  ETOH use  Overweight, Body mass index is 29.43 kg/m., recommend weight loss, diet and exercise as appropriate   Pulmonary nodule, right upper lobe  Incidental finding on CT neck   Repeat CT in 6-12 months  Other Active Problems    DISCHARGE EXAM Blood pressure 118/80, pulse 95, temperature 97.5 F (36.4 C), temperature source Oral, resp. rate 17, height 5\' 9"  (1.753 m), weight 199 lb 4.7 oz (90.4 kg), SpO2 93 %.  General - Well nourished, well developed, in no apparent distress.  Ophthalmologic - Fundi not visualized due to non cooperation.  Cardiovascular - Regular rate and rhythm.  Mental Status -  Level of arousal and orientation to time, place, and person were intact. Language exam showed able to follow simple commands, but mild hesitancy of speech, stuttering, mild to moderate dysarthria, intact repetition and naming 5/6.  Cranial Nerves II - XII - II - Visual field intact OU. III, IV, VI - Extraocular movements intact. V - Facial sensation intact bilaterally. VII - facial symmetrical VIII - Hard of hearing & vestibular intact bilaterally. X - Palate elevates symmetrically. XI - Chin turning & shoulder shrug intact bilaterally. XII - Tongue protrusion intact.  Motor Strength - The patient's strength was normal in all extremities and pronator drift was  absent.  Bulk was normal and fasciculations were absent.   Motor Tone - Muscle tone was assessed at the neck and appendages and was normal.  Reflexes - The patient's reflexes were 1+ in all extremities and he had no pathological reflexes.  Sensory - Light touch, temperature/pinprick were assessed and were symmetrical.  Coordination - The patient had normal movements in the hands with no ataxia or dysmetria.  Tremor was absent.  Gait and Station - normal gait, stride and stance.  Discharge Diet   Diet Heart Room service appropriate? Yes; Fluid consistency: Thin Diet - low sodium heart healthy liquids  DISCHARGE PLAN  Disposition:  Home with HH PT/OT/speech  aspirin 325 mg daily for secondary stroke prevention.  Ongoing risk factor control by Primary Care Physician at time of discharge  Follow-up PCP in 2 weeks.  Follow-up with Dr. Marvel [facility] in 6 weeks, office to schedule an appointment.  40 minutes were spent preparing discharge.  Marvel Plan, MD PhD Stroke Neurology 10/25/2016 12:54 PM

## 2016-10-30 ENCOUNTER — Other Ambulatory Visit: Payer: Self-pay

## 2016-10-30 ENCOUNTER — Telehealth: Payer: Self-pay | Admitting: Neurology

## 2016-10-30 MED ORDER — ATORVASTATIN CALCIUM 20 MG PO TABS: 20.0000 mg | ORAL_TABLET | Freq: Every day | ORAL | 5 refills | Status: AC

## 2016-10-30 MED ORDER — ATORVASTATIN CALCIUM 20 MG PO TABS: 20.0000 mg | ORAL_TABLET | Freq: Every day | ORAL | 5 refills | Status: DC

## 2016-10-30 NOTE — Telephone Encounter (Signed)
Stacy with [facility] is calling to let Dr. Roda ShuttersXu know that she saw the patient today and was reviewing the patient's medications. She states the patient does not have Lipitor which is on the list. The patient uses [facility] in [place].

## 2016-10-30 NOTE — Telephone Encounter (Signed)
[facility]n [facility]. Rn stated the order for lipitor was on his list and it stated print. Rn stated the medication was resent today. Stacy verbalized understanding and will notify.

## 2016-11-01 ENCOUNTER — Other Ambulatory Visit: Payer: Self-pay | Admitting: Nurse Practitioner

## 2016-11-01 ENCOUNTER — Encounter: Payer: Self-pay | Admitting: Primary Care

## 2016-11-01 ENCOUNTER — Ambulatory Visit (INDEPENDENT_AMBULATORY_CARE_PROVIDER_SITE_OTHER): Payer: Medicare Other | Admitting: Primary Care

## 2016-11-01 DIAGNOSIS — I10 Essential (primary) hypertension: Secondary | ICD-10-CM | POA: Diagnosis not present

## 2016-11-01 DIAGNOSIS — Z720 Tobacco use: Secondary | ICD-10-CM

## 2016-11-01 DIAGNOSIS — I63412 Cerebral infarction due to embolism of left middle cerebral artery: Secondary | ICD-10-CM | POA: Diagnosis not present

## 2016-11-01 DIAGNOSIS — R911 Solitary pulmonary nodule: Secondary | ICD-10-CM | POA: Diagnosis not present

## 2016-11-01 DIAGNOSIS — I639 Cerebral infarction, unspecified: Secondary | ICD-10-CM | POA: Diagnosis not present

## 2016-11-01 NOTE — Progress Notes (Signed)
Pre visit review using our clinic review tool, if applicable. No additional management support is needed unless otherwise documented below in the visit note. 

## 2016-11-01 NOTE — Assessment & Plan Note (Signed)
No prior diagnosis per patient, blood pressure above goal in clinic today. Blood pressure goal should be less than 140/90. We'll continue to monitor through his home readings. Will call for home readings in 2 weeks, if above goal will initiate low-dose treatment.

## 2016-11-01 NOTE — Assessment & Plan Note (Signed)
Incidental finding on CTA during hospitalization. Will order low-dose CT scan in 6 months for further evaluation. Discussed importance of tobacco cessation.

## 2016-11-01 NOTE — Patient Instructions (Addendum)
Take atorvastatin (Lipitor) 20 mg tablets for cholesterol. Take 1 tablet by mouth every evening.  Continue taking Aspirin 325 mg once daily.  Continue to monitor your blood pressure. It should run less than 140/90. If you see that number or higher on a consistent basis then please call me. Reduce consumption of salty/fried/fatty foods as this will raise your blood pressure.  Call you insurance company regarding coverage of hearing aids.  Follow up with your neurologist in January 2018 as scheduled.  Schedule a Medicare Wellness Visit with us in 3 months. We will recheck your cholesterol at that point.  It was a pleasure to meet you today! Please don't hesitate to call me with any questions. Welcome to [facility]!  DASH Eating Plan DASH stands for "Dietary Approaches to Stop Hypertension." The DASH eating plan is a healthy eating plan that has been shown to reduce high blood pressure (hypertension). Additional health benefits may include reducing the risk of type 2 diabetes mellitus, heart disease, and stroke. The DASH eating plan may also help with weight loss. WHAT DO I NEED TO KNOW ABOUT THE DASH EATING PLAN? For the DASH eating plan, you will follow these general guidelines:  Choose foods with a percent daily value for sodium of less than 5% (as listed on the food label).  Use salt-free seasonings or herbs instead of table salt or sea salt.  Check with your health care provider or pharmacist before using salt substitutes.  Eat lower-sodium products, often labeled as "lower sodium" or "no salt added."  Eat fresh foods.  Eat more vegetables, fruits, and low-fat dairy products.  Choose whole grains. Look for the word "whole" as the first word in the ingredient list.  Choose fish and skinless chicken or Mal[place]key more often than red meat. Limit fish, poultry, and meat to 6 oz (170 g) each day.  Limit sweets, desserts, sugars, and sugary drinks.  Choose heart-healthy fats.  Limit  cheese to 1 oz (28 g) per day.  Eat more home-cooked food and less restaurant, buffet, and fast food.  Limit fried foods.  Cook foods using methods other than frying.  Limit canned vegetables. If you do use them, rinse them well to decrease the sodium.  When eating at a restaurant, ask that your food be prepared with less salt, or no salt if possible. WHAT FOODS CAN I EAT? Seek help from a dietitian for individual calorie needs. Grains Whole grain or whole wheat bread. Brown rice. Whole grain or whole wheat pasta. Quinoa, bulgur, and whole grain cereals. Low-sodium cereals. Corn or whole wheat flour tortillas. Whole grain cornbread. Whole grain crackers. Low-sodium crackers. Vegetables Fresh or frozen vegetables (raw, steamed, roasted, or grilled). Low-sodium or reduced-sodium tomato and vegetable juices. Low-sodium or reduced-sodium tomato sauce and paste. Low-sodium or reduced-sodium canned vegetables.  Fruits All fresh, canned (in natural juice), or frozen fruits. Meat and Other Protein Products Ground beef (85% or leaner), grass-fed beef, or beef trimmed of fat. Skinless chicken or [place]key. Ground chicken or [place]key. Pork trimmed of fat. All fish and seafood. Eggs. Dried beans, peas, or lentils. Unsalted nuts and seeds. Unsalted canned beans. Dairy Low-fat dairy products, such as skim or 1% milk, 2% or reduced-fat cheeses, low-fat ricotta or cottage cheese, or plain low-fat yogurt. Low-sodium or reduced-sodium cheeses. Fats and Oils Tub margarines without trans fats. Light or reduced-fat mayonnaise and salad dressings (reduced sodium). Avocado. Safflower, olive, or canola oils. Natural peanut or almond butter. Other Unsalted popcorn and pretzels. The items  listed above may not be a complete list of recommended foods or beverages. Contact your dietitian for more options. WHAT FOODS ARE NOT RECOMMENDED? Grains White bread. White pasta. White rice. Refined cornbread. Bagels and  croissants. Crackers that contain trans fat. Vegetables Creamed or fried vegetables. Vegetables in a cheese sauce. Regular canned vegetables. Regular canned tomato sauce and paste. Regular tomato and vegetable juices. Fruits Dried fruits. Canned fruit in light or heavy syrup. Fruit juice. Meat and Other Protein Products Fatty cuts of meat. Ribs, chicken wings, bacon, sausage, bologna, salami, chitterlings, fatback, hot dogs, bratwurst, and packaged luncheon meats. Salted nuts and seeds. Canned beans with salt. Dairy Whole or 2% milk, cream, half-and-half, and cream cheese. Whole-fat or sweetened yogurt. Full-fat cheeses or blue cheese. Nondairy creamers and whipped toppings. Processed cheese, cheese spreads, or cheese curds. Condiments Onion and garlic salt, seasoned salt, table salt, and sea salt. Canned and packaged gravies. Worcestershire sauce. Tartar sauce. Barbecue sauce. Teriyaki sauce. Soy sauce, including reduced sodium. Steak sauce. Fish sauce. Oyster sauce. Cocktail sauce. Horseradish. Ketchup and mustard. Meat flavorings and tenderizers. Bouillon cubes. Hot sauce. Tabasco sauce. Marinades. Taco seasonings. Relishes. Fats and Oils Butter, stick margarine, lard, shortening, ghee, and bacon fat. Coconut, palm kernel, or palm oils. Regular salad dressings. Other Pickles and olives. Salted popcorn and pretzels. The items listed above may not be a complete list of foods and beverages to avoid. Contact your dietitian for more information. WHERE CAN I FIND MORE INFORMATION? National Heart, Lung, and Blood Institute: Cable[employer].itwww.nhlbi.nih.gov/health/health-topics/topics/dash/   This information is not intended to replace advice given to you by your health care provider. Make sure you discuss any questions you have with your health care provider.   Document Released: 12/06/2011 Document Revised: 01/07/2015 Document Reviewed: 10/21/2013 Elsevier Interactive Patient Education [employer]2016 Elsevier  Inc.

## 2016-11-01 NOTE — Assessment & Plan Note (Signed)
Follow-up scheduled with neurology in January 2018. Asymptomatic in the office today, discussed signs and symptoms of acute stroke and to report to the hospital immediately. Discussed importance of atorvastatin, he verbalized understanding.

## 2016-11-01 NOTE — Assessment & Plan Note (Signed)
Discussed importance of cessation  

## 2016-11-01 NOTE — Progress Notes (Signed)
Subjective:    Patient ID: Ronald Terry, male    DOB: [birth year], 65 y.o.   MRN: [id number]  HPI  Mr. Beverely PaceCheek is a 65 year old male who presents today to establish care and discuss the problems mentioned below. Will obtain old records.  1) Ear Fullness: Located to the right ear with increased difficulty with hearing. He denies pain, fevers, cough, shortness of breath. He does have a history of gradual decreased hearing given his prior occupation.   2) Hyperlipidemia: Currently managed on Lipitor 20 mg and Aspirin 325 mg that was initiated during hospitalization for CVA. He is confused with the directions and thought he could only take this medication at 6 PM as indicated on the bottle. He did not take last night's dose as he remembered at 8 PM.  3) Essential Hypertension: Denies prior diagnosis and has never been managed on medication. He reports that his home health, PT/OT providers have been checking his blood pressure and tell him that it's normal. He denies headaches, chest pain, visual changes, dizziness.   BP Readings from Last 3 Encounters:  11/01/16 (!) 144/96  10/25/16 133/90  04/26/15 (!) 140/103     4) CVA: [facility] with a chief complaint of sudden onset aphasia. Also with questionable right sided facial droop. He underwent CT head without evidence of bleeding. He was provided with tPA treatment and admitted to ICU for further treatment.   During his hospitalization on 10/23/16 he was noted to have worsening expressive aphasia with a diastolic BP of 115. He was administered IV labetalol and BP parameters were increased to 185/125 to trigger treatment. Repeat CT scan negative. CtA Neck with mild atheromatous plaque to carotid bifurcations without flow limiting stenosis; incidental finding of 7 mm pulmonary nodue to right upper lobe. CT head negative for large vessle occlusion; EEG unremarkable. His symptoms improved during hospitalization.  He was discharged  home on 10/26 with a prescription for atorvastatin 20 mg and aspirin 325 mg.   Since his discharge home he's feeling well. He is participating in PT/OT twice weekly. They've been monitoring his BP which has been "normal". His BP in the clinic today is at 152/96, 144/96 on recheck. He has follow up scheduled with his neurologist in January 2018. He denies dizziness, weakness, changes in his speech.   Review of Systems  Constitutional: Negative for unexpected weight change.  HENT: Negative for rhinorrhea.   Eyes: Negative for visual disturbance.  Respiratory: Negative for cough and shortness of breath.   Cardiovascular: Negative for chest pain.  Musculoskeletal: Negative for arthralgias.  Skin: Negative for color change.  Allergic/Immunologic: Negative for environmental allergies.  Neurological: Negative for dizziness and weakness.  Hematological: Negative for adenopathy.       Past Medical History:  Diagnosis Date  . CVA (cerebral vascular accident) (HCC) 10/23/2016  . HTN (hypertension)   . Hyperlipidemia   . Smoking      Social History   Social History  . Marital status: Single    Spouse name: N/A  . Number of children: N/A  . Years of education: N/A   Occupational History  . Not on file.   Social History Main Topics  . Smoking status: Former Smoker    Packs/day: 2.00    Years: 25.00    Types: Cigarettes  . Smokeless tobacco: Never Used  . Alcohol use Yes     Comment: occ  . Drug use: No  . Sexual activity: Not on file  Other Topics Concern  . Not on file   Social History Narrative   Single.   1 son. No grandchildren.   Lives with his brother.   Retired. Once worked in [employer].          No past surgical history on file.  No family history on file.  No Known Allergies  Current Outpatient Prescriptions on File Prior to Visit  Medication Sig Dispense Refill  . aspirin EC 325 MG EC tablet Take 1 tablet (325 mg total) by mouth daily. 30 tablet 5    . atorvastatin (LIPITOR) 20 MG tablet Take 1 tablet (20 mg total) by mouth daily at 6 PM. (Patient not taking: Reported on 11/01/2016) 30 tablet 5   No current facility-administered medications on file prior to visit.     BP (!) 144/96   Pulse 85   Temp 98 F (36.7 C) (Oral)   Ht 5\' 8"  (1.727 m)   Wt 195 lb 12.8 oz (88.8 kg)   SpO2 98%   BMI 29.77 kg/m    Objective:   Physical Exam  Constitutional: He is oriented to person, place, and time. He appears well-nourished.  Eyes: EOM are normal. Pupils are equal, round, and reactive to light.  Neck: Neck supple.  Cardiovascular: Normal rate and regular rhythm.   Pulmonary/Chest: Effort normal and breath sounds normal. He has no wheezes. He has no rales.  Neurological: He is alert and oriented to person, place, and time. No cranial nerve deficit.  Skin: Skin is warm and dry.  Psychiatric: He has a normal mood and affect.          Assessment & Plan:  Hospital follow-up:  [facility] with sudden onset of aphasia. Diagnosed with CVA and admitted for further treatment. Experienced elevations in blood pressure during hospitalization, this normalized upon discharge after treatment. Overall doing well since discharge , asymptomatic.   Blood pressure above goal in the clinic today, normal parameters should be less than 140/90. Will have him monitor his blood pressure over the next several weeks and call for his readings in 2 weeks. Discussed importance of atorvastatin and directions for taking. Also discussed signs and symptoms of acute stroke and report any of the symptoms immediately.   Follow-up scheduled with neurologist in January 2018. He verbalized understanding of this appointment. Follow-up in our clinic in 3 months. All Hospital notes, imaging, labs reviewed.   Morrie Sheldonlark,Kamuela Magos Kendal, NP

## 2016-12-12 ENCOUNTER — Ambulatory Visit (INDEPENDENT_AMBULATORY_CARE_PROVIDER_SITE_OTHER): Payer: Medicare Other

## 2016-12-12 ENCOUNTER — Other Ambulatory Visit: Payer: Self-pay | Admitting: Nurse Practitioner

## 2016-12-12 DIAGNOSIS — I4891 Unspecified atrial fibrillation: Secondary | ICD-10-CM

## 2016-12-12 DIAGNOSIS — I639 Cerebral infarction, unspecified: Secondary | ICD-10-CM

## 2017-01-08 ENCOUNTER — Ambulatory Visit: Payer: Medicare Other | Admitting: Neurology

## 2017-01-09 ENCOUNTER — Encounter: Payer: Self-pay | Admitting: Neurology

## 2017-01-23 ENCOUNTER — Telehealth: Payer: Self-pay | Admitting: Primary Care

## 2017-01-23 NOTE — Telephone Encounter (Signed)
Please notify the [facility] that the death certificate has been signed and is ready for pick up. Placed in Ronald Terry's inbox.

## 2017-01-23 NOTE — Telephone Encounter (Signed)
Gave death certificate to Lyla SonCarrie to copy for scan. Carrie notified the [facility] that it is ready for pick up.

## 2017-01-31 DEATH — deceased

## 2017-02-01 NOTE — Patient Outreach (Signed)
[facility] Care Management  02/01/2017  Newman PiesWillard Macqueen Oct 25, 1951 [id number]   Reviewing chart for outreach to patient for mRS. Patient is deceased. mRS = 6  Sherle PoeNicole Jalen Oberry, Conrad BurlingtonB.A.  [facility] Care Management Assistant

## 2018-06-15 IMAGING — CR DG CHEST 1V PORT
1 series · 1 of 1 positions shown · non-contrast
Comparison: None.

CLINICAL DATA: Short of breath, congestive heart failure

EXAM:
PORTABLE CHEST 1 VIEW

[AP]
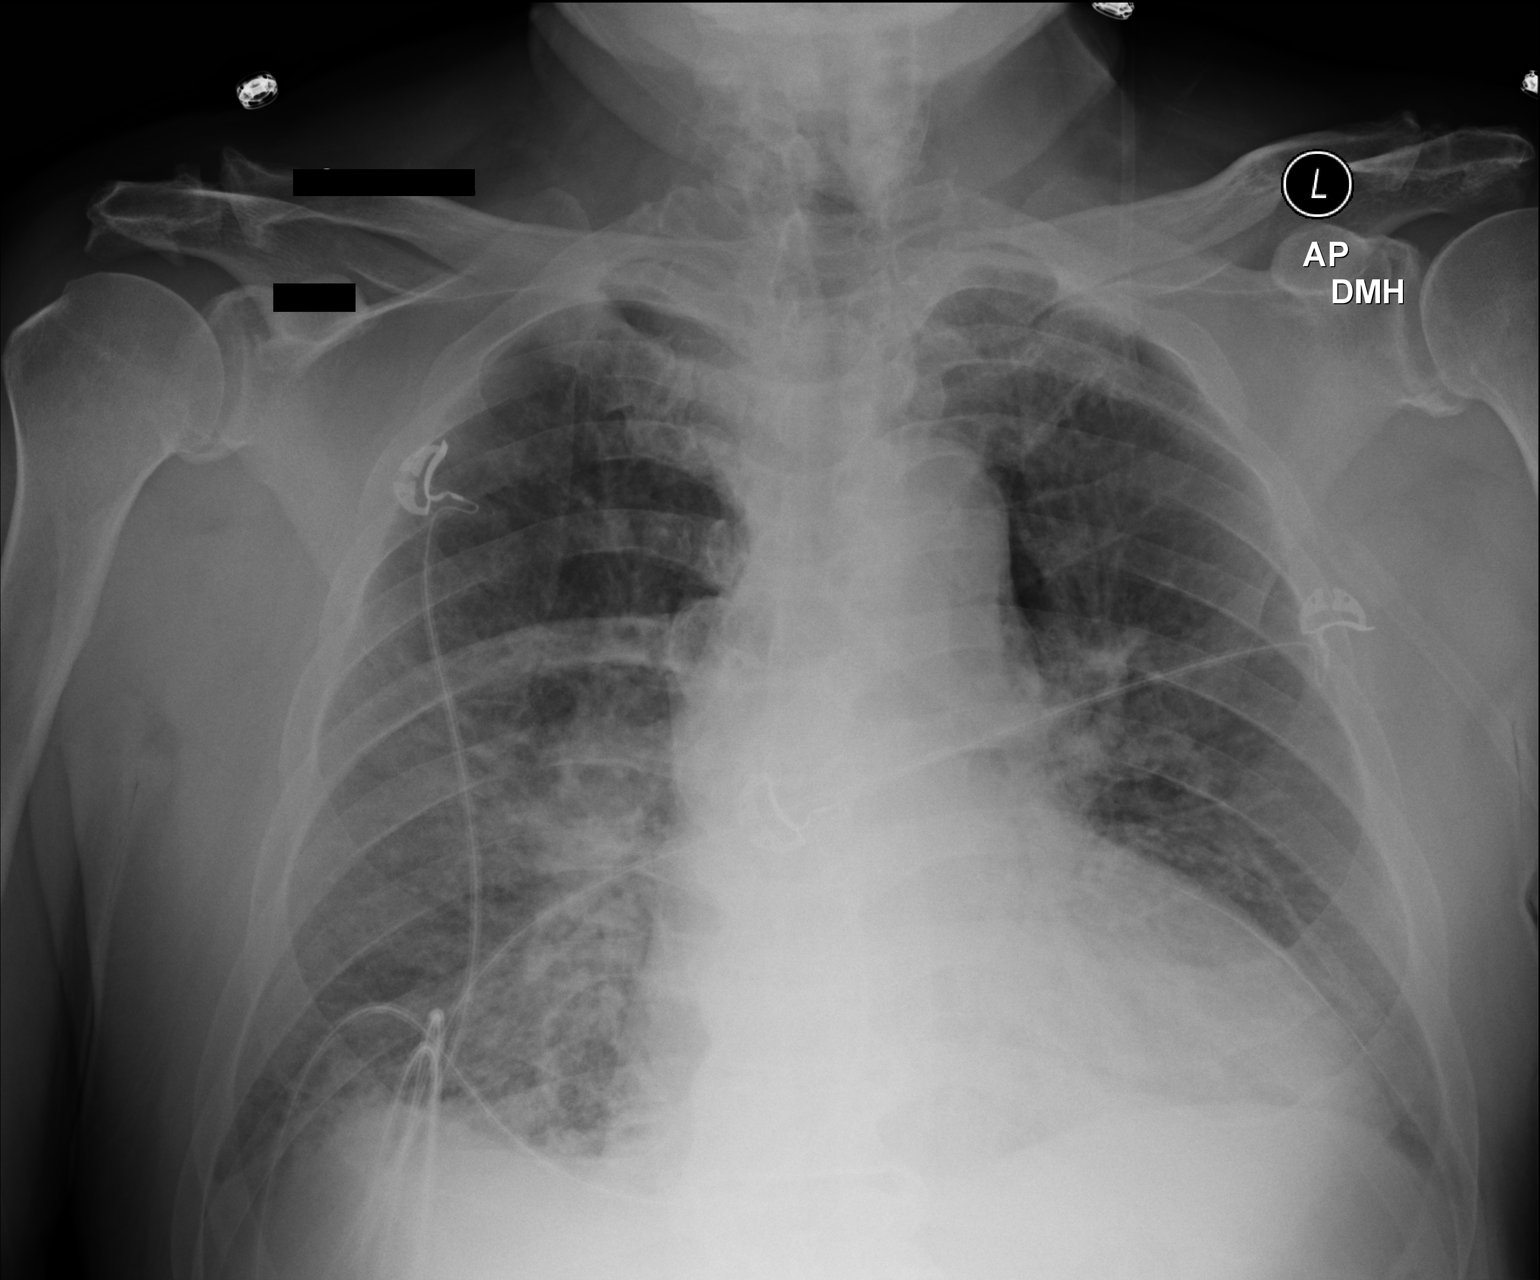

[1 of 1 positions shown; findings below may reference images not displayed]

FINDINGS: Normal cardiac silhouette. There is perihilar airspace disease in
the RIGHT lung similar to comparison exam. LEFT perihilar airspace
disease not as well appreciated.
IMPRESSION: Stable perihilar airspace disease greater on the RIGHT suggesting
pulmonary edema and less likely infection.
